# Patient Record
Sex: Male | Born: 1975 | Race: Black or African American | Hispanic: No | Marital: Married | Smoking: Never smoker
Health system: Southern US, Community
[De-identification: ages and names within clinical notes are randomized; demographics above are authoritative.]

## PROBLEM LIST (undated history)

## (undated) DIAGNOSIS — G4730 Sleep apnea, unspecified: Secondary | ICD-10-CM

## (undated) DIAGNOSIS — I10 Essential (primary) hypertension: Secondary | ICD-10-CM

## (undated) DIAGNOSIS — Z86718 Personal history of other venous thrombosis and embolism: Secondary | ICD-10-CM

## (undated) DIAGNOSIS — K219 Gastro-esophageal reflux disease without esophagitis: Secondary | ICD-10-CM

## (undated) DIAGNOSIS — M1990 Unspecified osteoarthritis, unspecified site: Secondary | ICD-10-CM

## (undated) HISTORY — PX: WISDOM TOOTH EXTRACTION: SHX21

## (undated) HISTORY — PX: APPENDECTOMY: SHX54

---
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
## 1998-02-10 ENCOUNTER — Encounter: Payer: Self-pay | Admitting: Emergency Medicine

## 1998-02-10 ENCOUNTER — Emergency Department (HOSPITAL_COMMUNITY): Admission: EM | Admit: 1998-02-10 | Discharge: 1998-02-10 | Payer: Self-pay | Admitting: Emergency Medicine

## 2001-06-11 ENCOUNTER — Encounter (HOSPITAL_BASED_OUTPATIENT_CLINIC_OR_DEPARTMENT_OTHER): Payer: Self-pay | Admitting: Internal Medicine

## 2001-06-11 ENCOUNTER — Encounter: Admission: RE | Admit: 2001-06-11 | Discharge: 2001-06-11 | Payer: Self-pay | Admitting: Internal Medicine

## 2013-07-01 ENCOUNTER — Encounter (HOSPITAL_BASED_OUTPATIENT_CLINIC_OR_DEPARTMENT_OTHER): Payer: Self-pay

## 2013-07-01 ENCOUNTER — Other Ambulatory Visit (HOSPITAL_BASED_OUTPATIENT_CLINIC_OR_DEPARTMENT_OTHER): Payer: Self-pay | Admitting: Family Medicine

## 2013-07-01 ENCOUNTER — Ambulatory Visit (HOSPITAL_BASED_OUTPATIENT_CLINIC_OR_DEPARTMENT_OTHER)
Admission: RE | Admit: 2013-07-01 | Discharge: 2013-07-01 | Disposition: A | Discharge status: Home / Self Care | Payer: BC Managed Care – PPO | Source: Ambulatory Visit | Attending: Family Medicine | Admitting: Family Medicine

## 2013-07-01 DIAGNOSIS — R791 Abnormal coagulation profile: Secondary | ICD-10-CM | POA: Insufficient documentation

## 2013-07-01 DIAGNOSIS — R7989 Other specified abnormal findings of blood chemistry: Secondary | ICD-10-CM

## 2013-07-01 MED ORDER — IOHEXOL 350 MG/ML SOLN
100.0000 mL | Freq: Once | INTRAVENOUS | Status: AC | PRN
  Administered 2013-07-01: 100 mL via INTRAVENOUS

## 2015-10-24 IMAGING — CT CT ANGIO CHEST
2 of 6 series · 19 of 36 positions shown · IV contrast (APPLIED)
Comparison: None.

CLINICAL DATA: Elevated D-dimer.

EXAM:
CT ANGIOGRAPHY CHEST WITH CONTRAST
TECHNIQUE: Multidetector CT imaging of the chest was performed using the
standard protocol during bolus administration of intravenous
contrast. Multiplanar CT image reconstructions and MIPs were
obtained to evaluate the vascular anatomy.
CONTRAST:  100mL OMNIPAQUE IOHEXOL 350 MG/ML SOLN

[Series 7: pe 1.0 b26f · axial · 0.74mm/px · z∈[-71,+203]mm · 18 of 306 slices shown]
[im 16/306  lung]
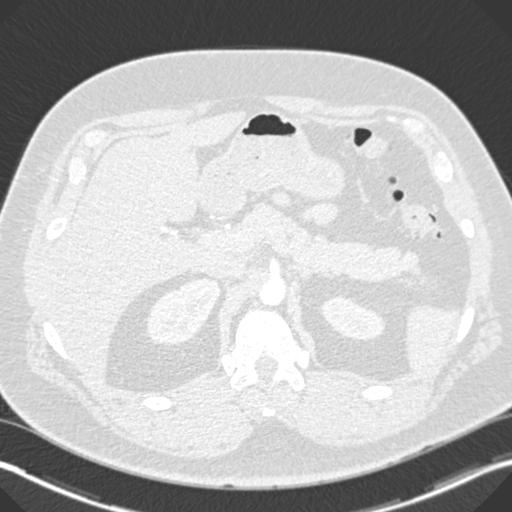
[im 31/306  mediastinal]
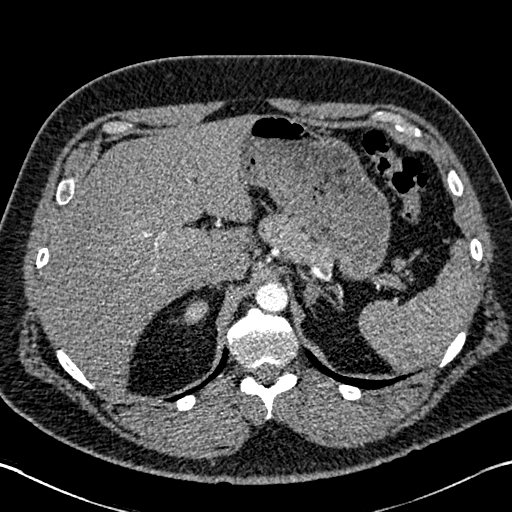
[im 46/306  lung]
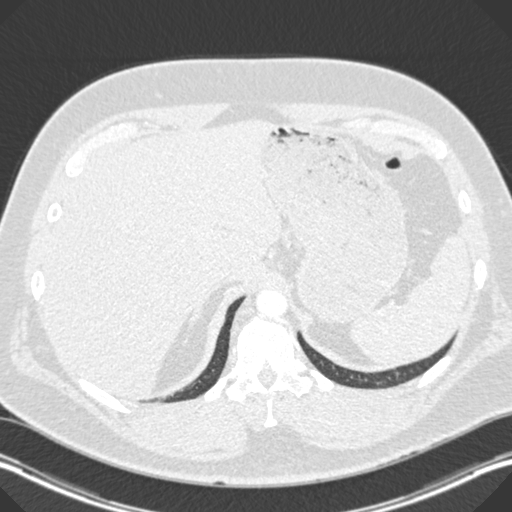
[im 62/306  mediastinal]
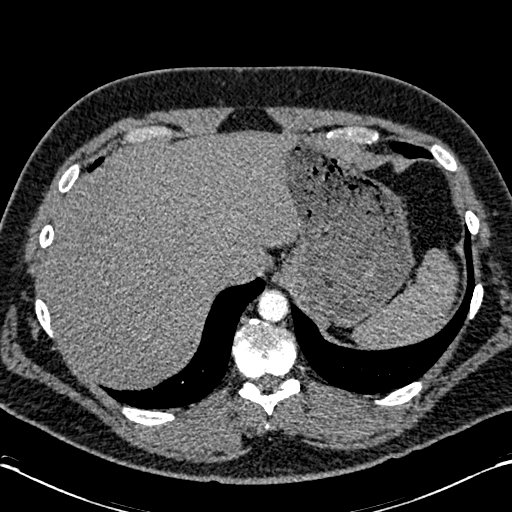
[im 77/306  lung]
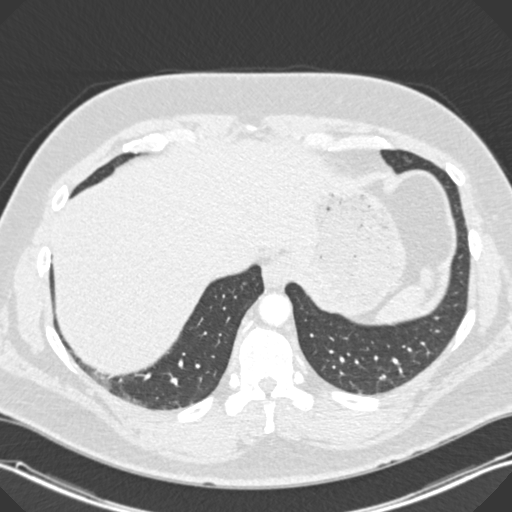
[im 92/306  mediastinal]
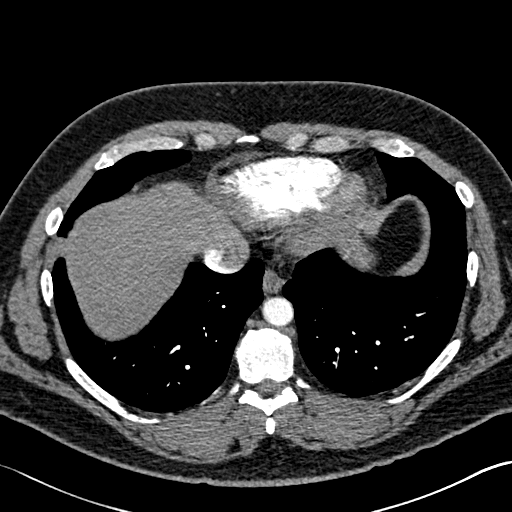
[im 107/306  lung]
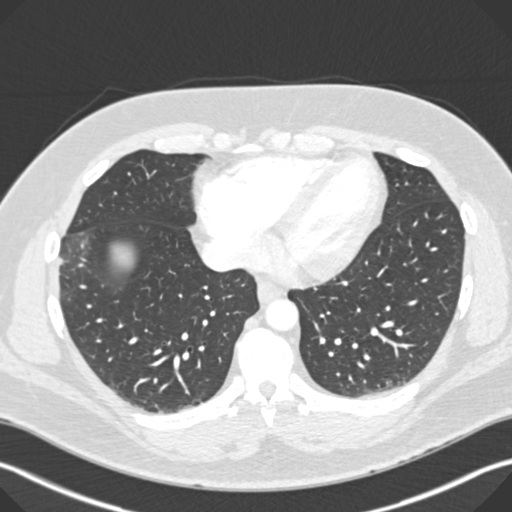
[im 123/306  mediastinal]
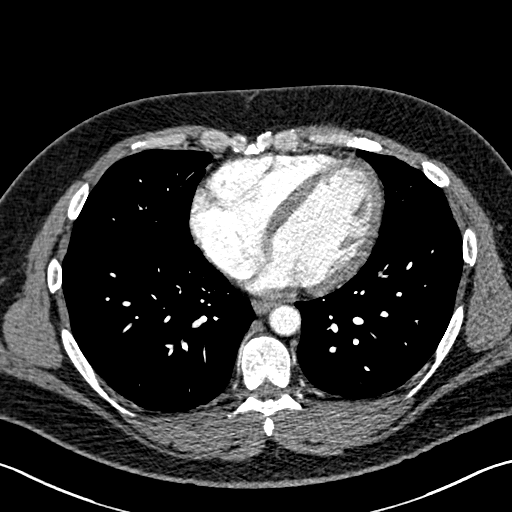
[im 138/306  lung]
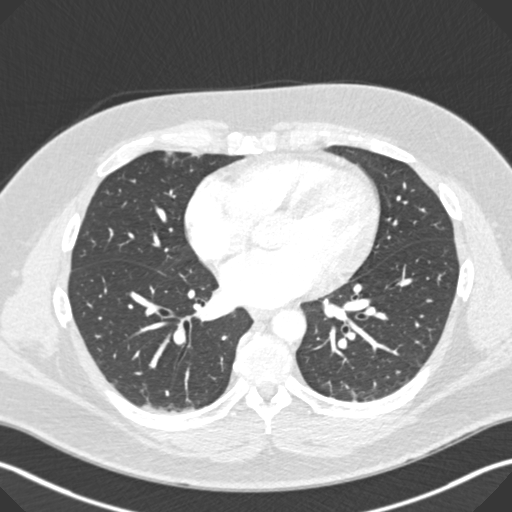
[im 168/306  mediastinal]
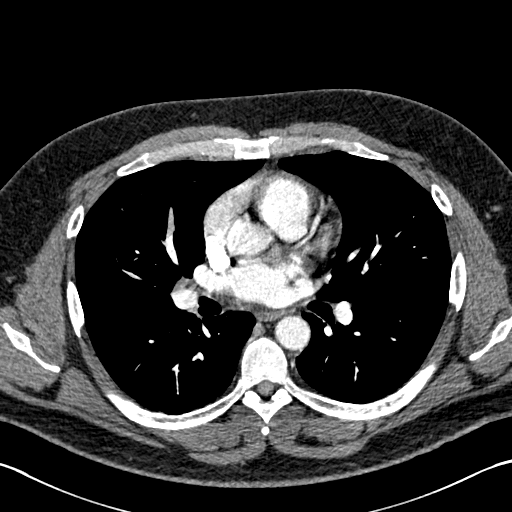
[im 184/306  lung]
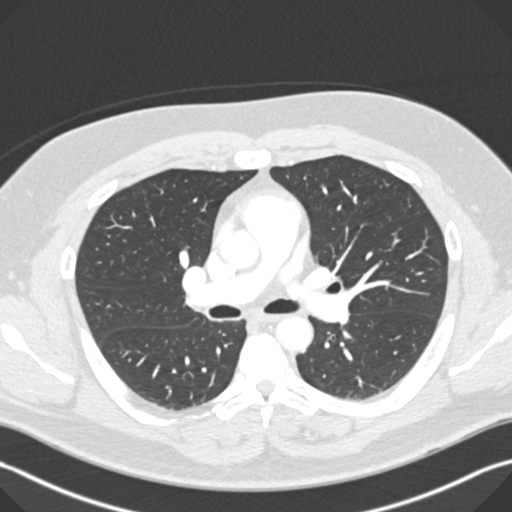
[im 199/306  mediastinal]
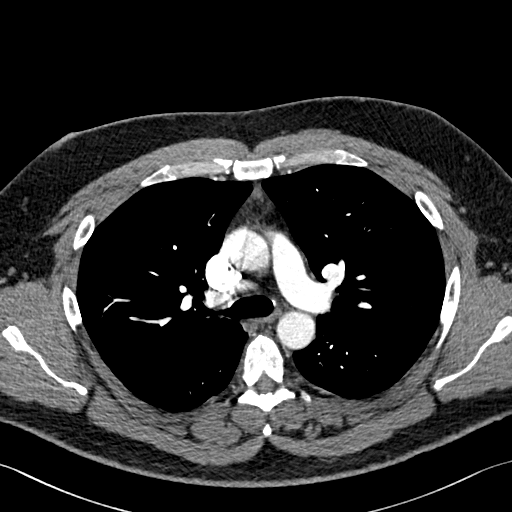
[im 214/306  lung]
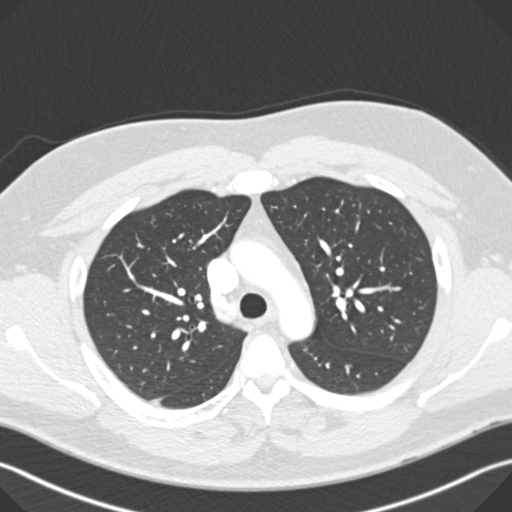
[im 229/306  mediastinal]
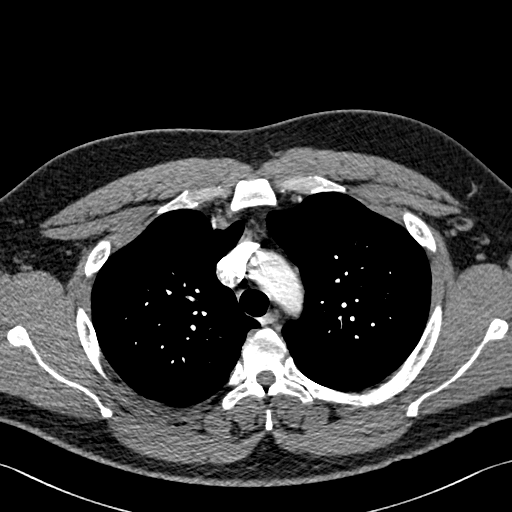
[im 245/306  lung]
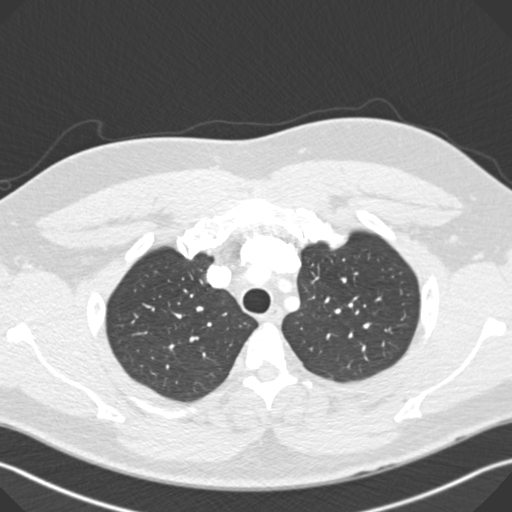
[im 260/306  mediastinal]
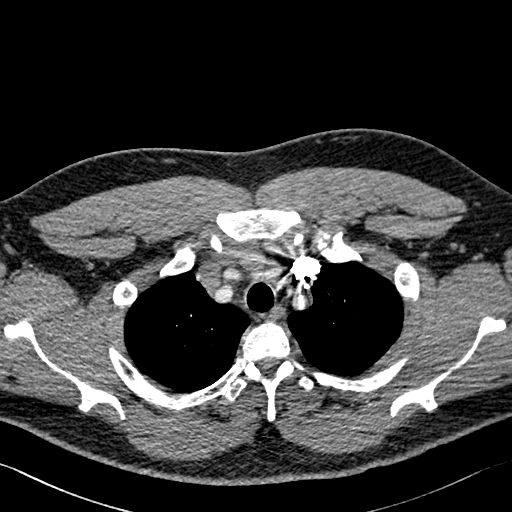
[im 275/306  lung]
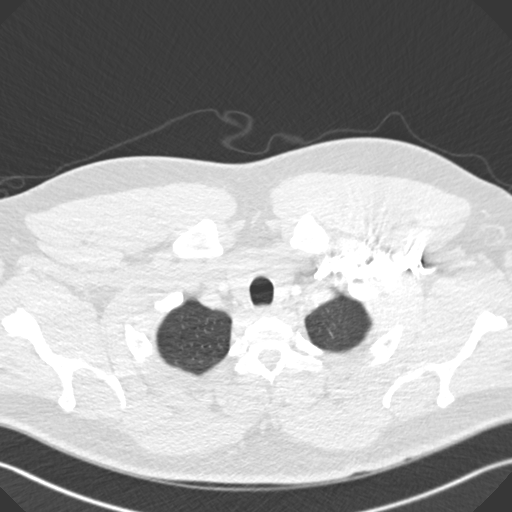
[im 290/306  mediastinal]
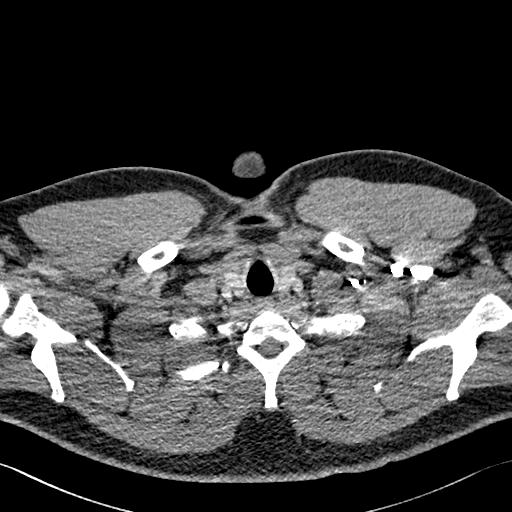

[Series 10: pe 2.0 coronal · coronal · 0.63mm/px · 1 of 128 slices shown]
[im 64/128  mediastinal]
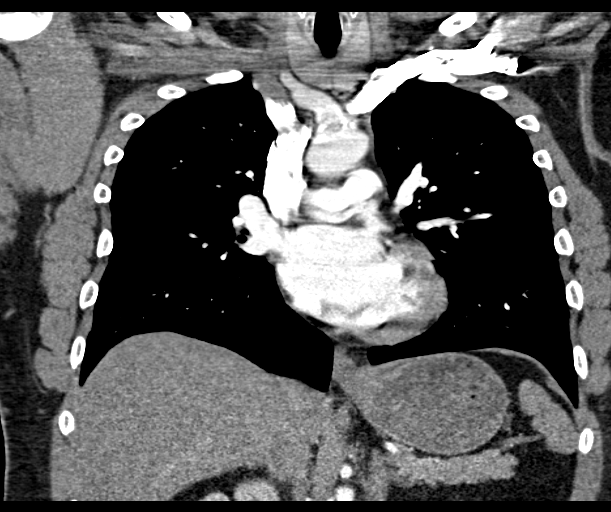

[19 of 36 positions shown; findings below may reference images not displayed]

FINDINGS: There is no evidence of pulmonary embolism. The pulmonary arteries
are of normal caliber. The thoracic aorta is also well opacified and
shows normal patency and caliber. No mediastinal abnormalities are
seen. The heart size is normal. No pleural or pericardial fluid is
identified.

Lungs show no evidence of infiltrates, edema or nodules. Focal area
of opacity at the right lateral lung base likely represents
atelectasis and/or scarring. Visualized upper abdomen is
unremarkable. No bony abnormalities are seen.

Review of the MIP images confirms the above findings.
IMPRESSION: Normal chest CTA demonstrating no evidence of pulmonary embolism or
other acute findings.

## 2019-06-08 ENCOUNTER — Ambulatory Visit: Payer: Self-pay | Attending: Internal Medicine

## 2019-06-08 DIAGNOSIS — Z23 Encounter for immunization: Secondary | ICD-10-CM

## 2019-06-08 NOTE — Progress Notes (Signed)
   [id number]  Name:  Davonta Stroot.    MRN: [id number] DOB: 10/30/1975  06/08/2019  Mr. Truex was observed post Covid-19 immunization for 15 minutes without incident. He was provided with Vaccine Information Sheet and instruction to access the V-Safe system.   Mr. Lamison was instructed to call 911 with any severe reactions post vaccine: Marland Kitchen Difficulty breathing  . Swelling of face and throat  . A fast heartbeat  . A bad rash all over body  . Dizziness and weakness   Immunizations Administered    Name Date Dose VIS Date Route   Pfizer COVID-19 Vaccine 06/08/2019  6:50 PM 0.3 mL 02/27/2019 Intramuscular   Manufacturer: [employer], [employer]   Lot: [id number]   NDC: [address]-4

## 2019-06-29 ENCOUNTER — Ambulatory Visit: Payer: Self-pay | Attending: Internal Medicine

## 2019-06-29 DIAGNOSIS — Z23 Encounter for immunization: Secondary | ICD-10-CM

## 2019-06-29 NOTE — Progress Notes (Signed)
   [id number]  Name:  Cory Cabrera.    MRN: [id number] DOB: 07/25/1975  06/29/2019  [name] was observed post Covid-19 immunization for 15 minutes without incident. He was provided with Vaccine Information Sheet and instruction to access the V-Safe system.   [name] was instructed to call 911 with any severe reactions post vaccine: Marland Kitchen Difficulty breathing  . Swelling of face and throat  . A fast heartbeat  . A bad rash all over body  . Dizziness and weakness   Immunizations Administered    Name Date Dose VIS Date Route   Pfizer COVID-19 Vaccine 06/29/2019  6:10 PM 0.3 mL 02/27/2019 Intramuscular   Manufacturer: [employer], [employer]   Lot: [id number]   NDC: [address]-0

## 2019-09-29 ENCOUNTER — Ambulatory Visit: Payer: Self-pay | Admitting: Student

## 2019-10-07 ENCOUNTER — Encounter (HOSPITAL_COMMUNITY): Payer: Self-pay

## 2019-10-07 NOTE — Progress Notes (Signed)
COVID Vaccine Completed: Yes Date COVID Vaccine completed: 06/08/19, 06/29/19 COVID vaccine manufacturer: Pfizer       PCP - Dr. Darlin Drop Cardiologist -   Chest x-ray -  EKG -  Stress Test -  ECHO -  Cardiac Cath -   Sleep Study -  CPAP -   Fasting Blood Sugar -  Checks Blood Sugar _____ times a day  Blood Thinner Instructions: Aspirin Instructions: Last Dose:  Anesthesia review:   Patient denies shortness of breath, fever, cough and chest pain at PAT appointment   Patient verbalized understanding of instructions that were given to them at the PAT appointment. Patient was also instructed that they will need to review over the PAT instructions again at home before surgery.

## 2019-10-07 NOTE — Patient Instructions (Addendum)
DUE TO COVID-19 ONLY ONE VISITOR ARE ALLOWED TO COME WITH YOU AND STAY IN THE WAITING ROOM ONLY DURING PRE OP AND PROCEDURE. THEN TWO VISITORS MAY VISIT WITH YOU IN YOUR PRIVATE ROOM DURING VISITING HOURS ONLY!! (10AM-8PM)   COVID SWAB TESTING MUST BE COMPLETED ON:  Saturday, October 17, 2019   @ 11:15 [address] [place] [facility] enter pre surgical testing line (Must self quarantine after testing. Follow instructions on handout.)             Your procedure is scheduled on: Wednesday, Aug. 4, 2021   Report to [facility] Main  Entrance   Report to Short Stay at 6:00 AM   [facility])   Call this number if you have problems the morning of surgery [phone]   Do not eat food :After Midnight.   May have liquids until 5:30 AM   day of surgery   CLEAR LIQUID DIET  Foods Allowed                                                                     Foods Excluded  Water, Black Coffee and tea, regular and decaf                             liquids that you cannot  Plain Jell-O in any flavor  (No red)                                           see through such as: Fruit ices (not with fruit pulp)                                     milk, soups, orange juice  Iced Popsicles (No red)                                    All solid food                                   Apple juices Sports drinks like Gatorade (No red) Lightly seasoned clear broth or consume(fat free) Sugar, honey syrup  Sample Menu Breakfast                                Lunch                                     Supper Cranberry juice                    Beef broth                            Chicken broth  Jell-O                                     Grape juice                           Apple juice Coffee or tea                        Jell-O                                      Popsicle                                                Coffee or tea                        Coffee or  tea   Complete one Ensure drink the morning of surgery at 5:30 AM the day of surgery.   Oral Hygiene is also important to reduce your risk of infection.                                    Remember - BRUSH YOUR TEETH THE MORNING OF SURGERY WITH YOUR REGULAR TOOTHPASTE   Do NOT smoke after Midnight   Take these medicines the morning of surgery with A SIP OF WATER: None                               You may not have any metal on your body including jewelry, and body piercings             Do not wear lotions, powders, perfumes/cologne, or deodorant                           Men may shave face and neck.   Do not bring valuables to the hospital. [facility] IS NOT             RESPONSIBLE   FOR VALUABLES.   Contacts, dentures or bridgework may not be worn into surgery.   Bring small overnight bag day of surgery.    Special Instructions: Bring a copy of your healthcare power of attorney and living will documents         the day of surgery if you haven't scanned them in before.              Please read over the following fact sheets you were given: IF YOU HAVE QUESTIONS ABOUT YOUR PRE OP INSTRUCTIONS PLEASE CALL [phone]   [facility] - Preparing for Surgery Before surgery, you can play an important role.  Because skin is not sterile, your skin needs to be as free of germs as possible.  You can reduce the number of germs on your skin by washing with CHG (chlorahexidine gluconate) soap before surgery.  CHG is an antiseptic cleaner which kills germs and bonds with the skin to continue killing germs even  after washing. Please DO NOT use if you have an allergy to CHG or antibacterial soaps.  If your skin becomes reddened/irritated stop using the CHG and inform your nurse when you arrive at Short Stay. Do not shave (including legs and underarms) for at least 48 hours prior to the first CHG shower.  You may shave your face/neck.  Please follow these instructions carefully:  1.  Shower with CHG  Soap the night before surgery and the  morning of surgery.  2.  If you choose to wash your hair, wash your hair first as usual with your normal  shampoo.  3.  After you shampoo, rinse your hair and body thoroughly to remove the shampoo.                             4.  Use CHG as you would any other liquid soap.  You can apply chg directly to the skin and wash.  Gently with a scrungie or clean washcloth.  5.  Apply the CHG Soap to your body ONLY FROM THE NECK DOWN.   Do   not use on face/ open                           Wound or open sores. Avoid contact with eyes, ears mouth and   genitals (private parts).                       Wash face,  Genitals (private parts) with your normal soap.             6.  Wash thoroughly, paying special attention to the area where your    surgery  will be performed.  7.  Thoroughly rinse your body with warm water from the neck down.  8.  DO NOT shower/wash with your normal soap after using and rinsing off the CHG Soap.                9.  Pat yourself dry with a clean towel.            10.  Wear clean pajamas.            11.  Place clean sheets on your bed the night of your first shower and do not  sleep with pets. Day of Surgery : Do not apply any lotions/deodorants the morning of surgery.  Please wear clean clothes to the hospital/surgery center.  FAILURE TO FOLLOW THESE INSTRUCTIONS MAY RESULT IN THE CANCELLATION OF YOUR SURGERY  PATIENT SIGNATURE_________________________________  NURSE SIGNATURE__________________________________  ________________________________________________________________________   Cory Cabrera  An incentive spirometer is a tool that can help keep your lungs clear and active. This tool measures how well you are filling your lungs with each breath. Taking long deep breaths may help reverse or decrease the chance of developing breathing (pulmonary) problems (especially infection) following:  A long period of time when you are  unable to move or be active. BEFORE THE PROCEDURE   If the spirometer includes an indicator to show your best effort, your nurse or respiratory therapist will set it to a desired goal.  If possible, sit up straight or lean slightly forward. Try not to slouch.  Hold the incentive spirometer in an upright position. INSTRUCTIONS FOR USE  1. Sit on the edge of your bed if possible, or sit up as far as you can in  bed or on a chair. 2. Hold the incentive spirometer in an upright position. 3. Breathe out normally. 4. Place the mouthpiece in your mouth and seal your lips tightly around it. 5. Breathe in slowly and as deeply as possible, raising the piston or the ball toward the top of the column. 6. Hold your breath for 3-5 seconds or for as long as possible. Allow the piston or ball to fall to the bottom of the column. 7. Remove the mouthpiece from your mouth and breathe out normally. 8. Rest for a few seconds and repeat Steps 1 through 7 at least 10 times every 1-2 hours when you are awake. Take your time and take a few normal breaths between deep breaths. 9. The spirometer may include an indicator to show your best effort. Use the indicator as a goal to work toward during each repetition. 10. After each set of 10 deep breaths, practice coughing to be sure your lungs are clear. If you have an incision (the cut made at the time of surgery), support your incision when coughing by placing a pillow or rolled up towels firmly against it. Once you are able to get out of bed, walk around indoors and cough well. You may stop using the incentive spirometer when instructed by your caregiver.  RISKS AND COMPLICATIONS  Take your time so you do not get dizzy or light-headed.  If you are in pain, you may need to take or ask for pain medication before doing incentive spirometry. It is harder to take a deep breath if you are having pain. AFTER USE  Rest and breathe slowly and easily.  It can be helpful to  keep track of a log of your progress. Your caregiver can provide you with a simple table to help with this. If you are using the spirometer at home, follow these instructions: SEEK MEDICAL CARE IF:   You are having difficultly using the spirometer.  You have trouble using the spirometer as often as instructed.  Your pain medication is not giving enough relief while using the spirometer.  You develop fever of 100.5 F (38.1 C) or higher. SEEK IMMEDIATE MEDICAL CARE IF:   You cough up bloody sputum that had not been present before.  You develop fever of 102 F (38.9 C) or greater.  You develop worsening pain at or near the incision site. MAKE SURE YOU:   Understand these instructions.  Will watch your condition.  Will get help right away if you are not doing well or get worse. Document Released: 07/16/2006 Document Revised: 05/28/2011 Document Reviewed: 09/16/2006 ExitCare Patient Information 2014 Exit[employer], [place].   ________________________________________________________________________  WHAT IS A BLOOD TRANSFUSION? Blood Transfusion Information  A transfusion is the replacement of blood or some of its parts. Blood is made up of multiple cells which provide different functions.  Red blood cells carry oxygen and are used for blood loss replacement.  White blood cells fight against infection.  Platelets control bleeding.  Plasma helps clot blood.  Other blood products are available for specialized needs, such as hemophilia or other clotting disorders. BEFORE THE TRANSFUSION  Who gives blood for transfusions?   Healthy volunteers who are fully evaluated to make sure their blood is safe. This is blood bank blood. Transfusion therapy is the safest it has ever been in the practice of medicine. Before blood is taken from a donor, a complete history is taken to make sure that person has no history of diseases nor engages in risky  social behavior (examples are intravenous drug  use or sexual activity with multiple partners). The donor's travel history is screened to minimize risk of transmitting infections, such as malaria. The donated blood is tested for signs of infectious diseases, such as HIV and hepatitis. The blood is then tested to be sure it is compatible with you in order to minimize the chance of a transfusion reaction. If you or a relative donates blood, this is often done in anticipation of surgery and is not appropriate for emergency situations. It takes many days to process the donated blood. RISKS AND COMPLICATIONS Although transfusion therapy is very safe and saves many lives, the main dangers of transfusion include:   Getting an infectious disease.  Developing a transfusion reaction. This is an allergic reaction to something in the blood you were given. Every precaution is taken to prevent this. The decision to have a blood transfusion has been considered carefully by your caregiver before blood is given. Blood is not given unless the benefits outweigh the risks. AFTER THE TRANSFUSION  Right after receiving a blood transfusion, you will usually feel much better and more energetic. This is especially true if your red blood cells have gotten low (anemic). The transfusion raises the level of the red blood cells which carry oxygen, and this usually causes an energy increase.  The nurse administering the transfusion will monitor you carefully for complications. HOME CARE INSTRUCTIONS  No special instructions are needed after a transfusion. You may find your energy is better. Speak with your caregiver about any limitations on activity for underlying diseases you may have. SEEK MEDICAL CARE IF:   Your condition is not improving after your transfusion.  You develop redness or irritation at the intravenous (IV) site. SEEK IMMEDIATE MEDICAL CARE IF:  Any of the following symptoms occur over the next 12 hours:  Shaking chills.  You have a temperature by mouth  above 102 F (38.9 C), not controlled by medicine.  Chest, back, or muscle pain.  People around you feel you are not acting correctly or are confused.  Shortness of breath or difficulty breathing.  Dizziness and fainting.  You get a rash or develop hives.  You have a decrease in urine output.  Your urine turns a dark color or changes to pink, red, or brown. Any of the following symptoms occur over the next 10 days:  You have a temperature by mouth above 102 F (38.9 C), not controlled by medicine.  Shortness of breath.  Weakness after normal activity.  The white part of the eye turns yellow (jaundice).  You have a decrease in the amount of urine or are urinating less often.  Your urine turns a dark color or changes to pink, red, or brown. Document Released: 03/02/2000 Document Revised: 05/28/2011 Document Reviewed: 10/20/2007 [facility] Patient Information 2014 [place], [place].  _______________________________________________________________________

## 2019-10-12 ENCOUNTER — Encounter (HOSPITAL_COMMUNITY): Payer: Self-pay

## 2019-10-12 ENCOUNTER — Other Ambulatory Visit: Payer: Self-pay

## 2019-10-12 ENCOUNTER — Encounter (HOSPITAL_COMMUNITY)
Admission: RE | Admit: 2019-10-12 | Discharge: 2019-10-12 | Disposition: A | Discharge status: Home / Self Care | Payer: BC Managed Care – PPO | Source: Ambulatory Visit | Attending: Orthopedic Surgery | Admitting: Orthopedic Surgery

## 2019-10-12 DIAGNOSIS — Z01812 Encounter for preprocedural laboratory examination: Secondary | ICD-10-CM | POA: Diagnosis present

## 2019-10-12 HISTORY — DX: Sleep apnea, unspecified: G47.30

## 2019-10-12 HISTORY — DX: Essential (primary) hypertension: I10

## 2019-10-12 HISTORY — DX: Gastro-esophageal reflux disease without esophagitis: K21.9

## 2019-10-12 HISTORY — DX: Personal history of other venous thrombosis and embolism: Z86.718

## 2019-10-12 HISTORY — DX: Unspecified osteoarthritis, unspecified site: M19.90

## 2019-10-12 LAB — COMPREHENSIVE METABOLIC PANEL
ALT: 17 U/L (ref 0–44)
AST: 19 U/L (ref 15–41)
Albumin: 4 g/dL (ref 3.5–5.0)
Alkaline Phosphatase: 56 U/L (ref 38–126)
Anion gap: 10 (ref 5–15)
BUN: 17 mg/dL (ref 6–20)
CO2: 28 mmol/L (ref 22–32)
Calcium: 9.5 mg/dL (ref 8.9–10.3)
Chloride: 102 mmol/L (ref 98–111)
Creatinine, Ser: 0.97 mg/dL (ref 0.61–1.24)
GFR calc Af Amer: >60 mL/min (ref >60)
GFR calc non Af Amer: >60 mL/min (ref >60)
Glucose, Bld: 103 mg/dL — ABNORMAL HIGH (ref 70–99)
Potassium: 4.3 mmol/L (ref 3.5–5.1)
Sodium: 140 mmol/L (ref 135–145)
Total Bilirubin: 0.7 mg/dL (ref 0.3–1.2)
Total Protein: 7.2 g/dL (ref 6.5–8.1)

## 2019-10-12 LAB — CBC
HCT: 45.1 % (ref 39.0–52.0)
Hemoglobin: 15.3 g/dL (ref 13.0–17.0)
MCH: 34.1 pg — ABNORMAL HIGH (ref 26.0–34.0)
MCHC: 33.9 g/dL (ref 30.0–36.0)
MCV: 100.4 fL — ABNORMAL HIGH (ref 80.0–100.0)
Platelets: 229 10*3/uL (ref 150–400)
RBC: 4.49 MIL/uL (ref 4.22–5.81)
RDW: 12.7 % (ref 11.5–15.5)
WBC: 4.9 10*3/uL (ref 4.0–10.5)
nRBC: 0 % (ref 0.0–0.2)

## 2019-10-12 LAB — URINALYSIS, ROUTINE W REFLEX MICROSCOPIC
Bilirubin Urine: NEGATIVE
Glucose, UA: NEGATIVE mg/dL
Hgb urine dipstick: NEGATIVE
Ketones, ur: NEGATIVE mg/dL
Leukocytes,Ua: NEGATIVE
Nitrite: NEGATIVE
Protein, ur: NEGATIVE mg/dL
Specific Gravity, Urine: 1.031 — ABNORMAL HIGH (ref 1.005–1.030)
pH: 5 (ref 5.0–8.0)

## 2019-10-12 LAB — SURGICAL PCR SCREEN
MRSA, PCR: NEGATIVE
Staphylococcus aureus: NEGATIVE

## 2019-10-12 LAB — PROTIME-INR
INR: 1 (ref 0.8–1.2)
Prothrombin Time: 12.9 seconds (ref 11.4–15.2)

## 2019-10-12 NOTE — Progress Notes (Signed)
COVID Vaccine Completed: Yes Date COVID Vaccine completed: 06/08/19, 06/29/19 COVID vaccine manufacturer: Pfizer       PCP - Dr. Darlin Drop Cardiologist - N/A  Chest x-ray - N/A  EKG - N/A  Stress Test - N/A  ECHO - N/A  Cardiac Cath - N/A   Sleep Study - N/A CPAP - N/A  Fasting Blood Sugar - N/A  Checks Blood Sugar _____ times a day  Blood Thinner Instructions:N/A  Aspirin Instructions:N/A  Last Dose:  Anesthesia review:   Patient denies shortness of breath, fever, cough and chest pain at PAT appointment   Patient verbalized understanding of instructions that were given to them at the PAT appointment. Patient was also instructed that they will need to review over the PAT instructions again at home before surgery.

## 2019-10-12 NOTE — Progress Notes (Signed)
   10/12/19 0823  OBSTRUCTIVE SLEEP APNEA  Have you ever been diagnosed with sleep apnea through a sleep study? No  Do you snore loudly (loud enough to be heard through closed doors)?  1  Do you often feel tired, fatigued, or sleepy during the daytime (such as falling asleep during driving or talking to someone)? 1  Has anyone observed you stop breathing during your sleep? 1  Do you have, or are you being treated for high blood pressure? 1  BMI more than 35 kg/m2? 1  Age > 50 (1-yes) 0  Neck circumference greater than:Male 16 inches or larger, Male 17inches or larger? 1  Male Gender (Yes=1) 1  Obstructive Sleep Apnea Score 7

## 2019-10-13 ENCOUNTER — Ambulatory Visit: Payer: Self-pay | Admitting: Student

## 2019-10-13 NOTE — H&P (Signed)
TOTAL HIP ADMISSION H&P  Patient is admitted for right total hip arthroplasty.  Subjective:  Chief Complaint: right hip pain  HPI: Cory Cabrera., 44 y.o. male, has a history of pain and functional disability in the right hip(s) due to arthritis and patient has failed non-surgical conservative treatments for greater than 12 weeks to include NSAID's and/or analgesics and activity modification.  Onset of symptoms was gradual starting >10 years ago with gradually worsening course since that time.The patient noted no past surgery on the right hip(s).  Patient currently rates pain in the right hip at 8 out of 10 with activity. Patient has worsening of pain with activity and weight bearing, pain that interfers with activities of daily living and pain with passive range of motion. Patient has evidence of subchondral sclerosis and joint space narrowing by imaging studies. This condition presents safety issues increasing the risk of falls. There is no current active infection.  There are no problems to display for this patient.  Past Medical History:  Diagnosis Date  . Arthritis   . GERD (gastroesophageal reflux disease)    No longer issue  . History of blood clots    coughed up  . Hypertension   . Sleep apnea     Past Surgical History:  Procedure Laterality Date  . APPENDECTOMY    . WISDOM TOOTH EXTRACTION      Current Outpatient Medications  Medication Sig Dispense Refill Last Dose  . losartan-hydrochlorothiazide (HYZAAR) 100-12.5 MG tablet Take 1 tablet by mouth daily.      No current facility-administered medications for this visit.   Allergies  Allergen Reactions  . Iohexol Nausea And Vomiting    Pt vomited after contrast injection    Social History   Tobacco Use  . Smoking status: Never Smoker  . Smokeless tobacco: Never Used  Substance Use Topics  . Alcohol use: Never    No family history on file.   Review of Systems  Musculoskeletal: Positive for arthralgias.   All other systems reviewed and are negative.   Objective:  Physical Exam Constitutional:      Appearance: Normal appearance.  HENT:     Head: Normocephalic and atraumatic.  Eyes:     Extraocular Movements: Extraocular movements intact.     Pupils: Pupils are equal, round, and reactive to light.  Cardiovascular:     Rate and Rhythm: Normal rate and regular rhythm.     Pulses: Normal pulses.     Heart sounds: Normal heart sounds.  Pulmonary:     Effort: Pulmonary effort is normal.     Breath sounds: Normal breath sounds.  Abdominal:     Palpations: Abdomen is soft.     Tenderness: There is no abdominal tenderness.  Genitourinary:    Comments: Deferred Musculoskeletal:     Cervical back: Normal range of motion.     Comments: Examination of the right hip reveals the skin is clear. No wounds or lesions. He does have a significant leg length discrepancy, right is shorter than the left by about 1 inch. The right hip is extremely stiff. There is a 30 degree flexion contracture and I can flex them up to 55 degrees with significant discomfort. There is no internal rotation and approximately 10 degrees of external rotation.   Skin:    General: Skin is warm and dry.  Neurological:     General: No focal deficit present.     Mental Status: He is alert and oriented to person, place, and  time.  Psychiatric:        Mood and Affect: Mood normal.     Vital signs in last 24 hours: @VSRANGES @  Labs:   Estimated body mass index is 36.48 kg/m as calculated from the following:   Height as of 10/12/19: 6' (1.829 m).   Weight as of 10/12/19: 122 kg.   Imaging Review Plain radiographs demonstrate severe degenerative joint disease of the right hip(s). The bone quality appears to be adequate for age and reported activity level.      Assessment/Plan:  End stage arthritis, right hip(s)  The patient history, physical examination, clinical judgement of the provider and imaging studies are  consistent with end stage degenerative joint disease of the right hip(s) and total hip arthroplasty is deemed medically necessary. The treatment options including medical management, injection therapy, arthroscopy and arthroplasty were discussed at length. The risks and benefits of total hip arthroplasty were presented and reviewed. The risks due to aseptic loosening, infection, stiffness, dislocation/subluxation,  thromboembolic complications and other imponderables were discussed.  The patient acknowledged the explanation, agreed to proceed with the plan and consent was signed. Patient is being admitted for inpatient treatment for surgery, pain control, PT, OT, prophylactic antibiotics, VTE prophylaxis, progressive ambulation and ADL's and discharge planning.The patient is planning to be discharged home with his wife where he will complete a home exercise program.

## 2019-10-13 NOTE — H&P (View-Only) (Signed)
TOTAL HIP ADMISSION H&P  Patient is admitted for right total hip arthroplasty.  Subjective:  Chief Complaint: right hip pain  HPI: Cory Primus., 44 y.o. male, has a history of pain and functional disability in the right hip(s) due to arthritis and patient has failed non-surgical conservative treatments for greater than 12 weeks to include NSAID's and/or analgesics and activity modification.  Onset of symptoms was gradual starting >10 years ago with gradually worsening course since that time.The patient noted no past surgery on the right hip(s).  Patient currently rates pain in the right hip at 8 out of 10 with activity. Patient has worsening of pain with activity and weight bearing, pain that interfers with activities of daily living and pain with passive range of motion. Patient has evidence of subchondral sclerosis and joint space narrowing by imaging studies. This condition presents safety issues increasing the risk of falls. There is no current active infection.  There are no problems to display for this patient.  Past Medical History:  Diagnosis Date  . Arthritis   . GERD (gastroesophageal reflux disease)    No longer issue  . History of blood clots    coughed up  . Hypertension   . Sleep apnea     Past Surgical History:  Procedure Laterality Date  . APPENDECTOMY    . WISDOM TOOTH EXTRACTION      Current Outpatient Medications  Medication Sig Dispense Refill Last Dose  . losartan-hydrochlorothiazide (HYZAAR) 100-12.5 MG tablet Take 1 tablet by mouth daily.      No current facility-administered medications for this visit.   Allergies  Allergen Reactions  . Iohexol Nausea And Vomiting    Pt vomited after contrast injection    Social History   Tobacco Use  . Smoking status: Never Smoker  . Smokeless tobacco: Never Used  Substance Use Topics  . Alcohol use: Never    No family history on file.   Review of Systems  Musculoskeletal: Positive for arthralgias.   All other systems reviewed and are negative.   Objective:  Physical Exam Constitutional:      Appearance: Normal appearance.  HENT:     Head: Normocephalic and atraumatic.  Eyes:     Extraocular Movements: Extraocular movements intact.     Pupils: Pupils are equal, round, and reactive to light.  Cardiovascular:     Rate and Rhythm: Normal rate and regular rhythm.     Pulses: Normal pulses.     Heart sounds: Normal heart sounds.  Pulmonary:     Effort: Pulmonary effort is normal.     Breath sounds: Normal breath sounds.  Abdominal:     Palpations: Abdomen is soft.     Tenderness: There is no abdominal tenderness.  Genitourinary:    Comments: Deferred Musculoskeletal:     Cervical back: Normal range of motion.     Comments: Examination of the right hip reveals the skin is clear. No wounds or lesions. He does have a significant leg length discrepancy, right is shorter than the left by about 1 inch. The right hip is extremely stiff. There is a 30 degree flexion contracture and I can flex them up to 55 degrees with significant discomfort. There is no internal rotation and approximately 10 degrees of external rotation.   Skin:    General: Skin is warm and dry.  Neurological:     General: No focal deficit present.     Mental Status: He is alert and oriented to person, place, and  time.  Psychiatric:        Mood and Affect: Mood normal.     Vital signs in last 24 hours: @VSRANGES@  Labs:   Estimated body mass index is 36.48 kg/m as calculated from the following:   Height as of 10/12/19: 6' (1.829 m).   Weight as of 10/12/19: 122 kg.   Imaging Review Plain radiographs demonstrate severe degenerative joint disease of the right hip(s). The bone quality appears to be adequate for age and reported activity level.      Assessment/Plan:  End stage arthritis, right hip(s)  The patient history, physical examination, clinical judgement of the provider and imaging studies are  consistent with end stage degenerative joint disease of the right hip(s) and total hip arthroplasty is deemed medically necessary. The treatment options including medical management, injection therapy, arthroscopy and arthroplasty were discussed at length. The risks and benefits of total hip arthroplasty were presented and reviewed. The risks due to aseptic loosening, infection, stiffness, dislocation/subluxation,  thromboembolic complications and other imponderables were discussed.  The patient acknowledged the explanation, agreed to proceed with the plan and consent was signed. Patient is being admitted for inpatient treatment for surgery, pain control, PT, OT, prophylactic antibiotics, VTE prophylaxis, progressive ambulation and ADL's and discharge planning.The patient is planning to be discharged home with his wife where he will complete a home exercise program.  

## 2019-10-17 ENCOUNTER — Other Ambulatory Visit (HOSPITAL_COMMUNITY)
Admission: RE | Admit: 2019-10-17 | Discharge: 2019-10-17 | Disposition: A | Discharge status: Home / Self Care | Payer: BC Managed Care – PPO | Source: Ambulatory Visit | Attending: Orthopedic Surgery | Admitting: Orthopedic Surgery

## 2019-10-17 DIAGNOSIS — Z01818 Encounter for other preprocedural examination: Secondary | ICD-10-CM | POA: Diagnosis not present

## 2019-10-17 DIAGNOSIS — Z20822 Contact with and (suspected) exposure to covid-19: Secondary | ICD-10-CM | POA: Insufficient documentation

## 2019-10-17 LAB — SARS CORONAVIRUS 2 (TAT 6-24 HRS): SARS Coronavirus 2: NEGATIVE

## 2019-10-20 MED ORDER — DEXTROSE 5 % IV SOLN
3.0000 g | INTRAVENOUS | Status: AC
  Administered 2019-10-21: 3 g via INTRAVENOUS
  Filled 2019-10-20: qty 3

## 2019-10-21 ENCOUNTER — Observation Stay (HOSPITAL_COMMUNITY): Payer: BC Managed Care – PPO

## 2019-10-21 ENCOUNTER — Ambulatory Visit (HOSPITAL_COMMUNITY): Payer: BC Managed Care – PPO | Admitting: Physician Assistant

## 2019-10-21 ENCOUNTER — Ambulatory Visit (HOSPITAL_COMMUNITY): Payer: BC Managed Care – PPO

## 2019-10-21 ENCOUNTER — Encounter (HOSPITAL_COMMUNITY)
Admission: RE | Disposition: A | Discharge status: Home / Self Care | Payer: Self-pay | Source: Home / Self Care | Attending: Orthopedic Surgery

## 2019-10-21 ENCOUNTER — Observation Stay (HOSPITAL_COMMUNITY)
Admission: RE | Admit: 2019-10-21 | Discharge: 2019-10-22 | Disposition: A | Discharge status: Home / Self Care | Payer: BC Managed Care – PPO | Attending: Orthopedic Surgery | Admitting: Orthopedic Surgery

## 2019-10-21 ENCOUNTER — Other Ambulatory Visit: Payer: Self-pay

## 2019-10-21 ENCOUNTER — Encounter (HOSPITAL_COMMUNITY): Payer: Self-pay | Admitting: Orthopedic Surgery

## 2019-10-21 ENCOUNTER — Ambulatory Visit (HOSPITAL_COMMUNITY): Payer: BC Managed Care – PPO | Admitting: Certified Registered Nurse Anesthetist

## 2019-10-21 DIAGNOSIS — I10 Essential (primary) hypertension: Secondary | ICD-10-CM | POA: Insufficient documentation

## 2019-10-21 DIAGNOSIS — M1611 Unilateral primary osteoarthritis, right hip: Principal | ICD-10-CM | POA: Diagnosis present

## 2019-10-21 DIAGNOSIS — Z09 Encounter for follow-up examination after completed treatment for conditions other than malignant neoplasm: Secondary | ICD-10-CM

## 2019-10-21 DIAGNOSIS — Z419 Encounter for procedure for purposes other than remedying health state, unspecified: Secondary | ICD-10-CM

## 2019-10-21 DIAGNOSIS — M25551 Pain in right hip: Secondary | ICD-10-CM | POA: Diagnosis present

## 2019-10-21 DIAGNOSIS — M1711 Unilateral primary osteoarthritis, right knee: Secondary | ICD-10-CM | POA: Diagnosis present

## 2019-10-21 HISTORY — PX: TOTAL HIP ARTHROPLASTY: SHX124

## 2019-10-21 LAB — TYPE AND SCREEN
ABO/RH(D): O POS
Antibody Screen: NEGATIVE

## 2019-10-21 LAB — ABO/RH: ABO/RH(D): O POS

## 2019-10-21 SURGERY — ARTHROPLASTY, HIP, TOTAL, ANTERIOR APPROACH
Anesthesia: Spinal | Site: Hip | Laterality: Right

## 2019-10-21 MED ORDER — POLYETHYLENE GLYCOL 3350 17 G PO PACK: 17.0000 g | PACK | Freq: Every day | ORAL | Status: DC | PRN

## 2019-10-21 MED ORDER — LACTATED RINGERS IV SOLN: INTRAVENOUS | Status: DC

## 2019-10-21 MED ORDER — CEFAZOLIN SODIUM-DEXTROSE 2-4 GM/100ML-% IV SOLN
2.0000 g | Freq: Four times a day (QID) | INTRAVENOUS | Status: AC
  Administered 2019-10-21 (×2): 2 g via INTRAVENOUS
  Filled 2019-10-21 (×2): qty 100

## 2019-10-21 MED ORDER — TRANEXAMIC ACID-NACL 1000-0.7 MG/100ML-% IV SOLN
1000.0000 mg | INTRAVENOUS | Status: AC
  Administered 2019-10-21: 1000 mg via INTRAVENOUS
  Filled 2019-10-21: qty 100

## 2019-10-21 MED ORDER — BUPIVACAINE HCL 0.5 % IJ SOLN: INTRAMUSCULAR | Status: DC | PRN

## 2019-10-21 MED ORDER — LIDOCAINE 2% (20 MG/ML) 5 ML SYRINGE
INTRAMUSCULAR | Status: AC
  Filled 2019-10-21: qty 5

## 2019-10-21 MED ORDER — HYDROCODONE-ACETAMINOPHEN 7.5-325 MG PO TABS: 1.0000 | ORAL_TABLET | ORAL | Status: DC | PRN

## 2019-10-21 MED ORDER — ONDANSETRON HCL 4 MG/2ML IJ SOLN
4.0000 mg | Freq: Four times a day (QID) | INTRAMUSCULAR | Status: DC | PRN
  Administered 2019-10-21: 4 mg via INTRAVENOUS
  Filled 2019-10-21: qty 2

## 2019-10-21 MED ORDER — BUPIVACAINE HCL (PF) 0.5 % IJ SOLN
INTRAMUSCULAR | Status: DC | PRN
Start: 2019-10-21 — End: 2019-10-21
  Administered 2019-10-21: 3 mL via INTRATHECAL

## 2019-10-21 MED ORDER — EPHEDRINE 5 MG/ML INJ
INTRAVENOUS | Status: AC
  Filled 2019-10-21: qty 10

## 2019-10-21 MED ORDER — SODIUM CHLORIDE 0.9 % IV SOLN
INTRAVENOUS | Status: DC
  Administered 2019-10-21: 1000 mL via INTRAVENOUS

## 2019-10-21 MED ORDER — MIDAZOLAM HCL 5 MG/5ML IJ SOLN
INTRAMUSCULAR | Status: DC | PRN
  Administered 2019-10-21: 2 mg via INTRAVENOUS

## 2019-10-21 MED ORDER — BUPIVACAINE-EPINEPHRINE (PF) 0.25% -1:200000 IJ SOLN
INTRAMUSCULAR | Status: AC
  Filled 2019-10-21: qty 30

## 2019-10-21 MED ORDER — MORPHINE SULFATE (PF) 2 MG/ML IV SOLN
0.5000 mg | INTRAVENOUS | Status: DC | PRN
  Administered 2019-10-21: 1 mg via INTRAVENOUS
  Filled 2019-10-21: qty 1

## 2019-10-21 MED ORDER — 0.9 % SODIUM CHLORIDE (POUR BTL) OPTIME
TOPICAL | Status: DC | PRN
  Administered 2019-10-21: 1000 mL

## 2019-10-21 MED ORDER — SODIUM CHLORIDE (PF) 0.9 % IJ SOLN
INTRAMUSCULAR | Status: AC
  Filled 2019-10-21: qty 50

## 2019-10-21 MED ORDER — ORAL CARE MOUTH RINSE: 15.0000 mL | Freq: Once | OROMUCOSAL | Status: AC

## 2019-10-21 MED ORDER — FENTANYL CITRATE (PF) 100 MCG/2ML IJ SOLN
INTRAMUSCULAR | Status: AC
  Filled 2019-10-21: qty 2

## 2019-10-21 MED ORDER — DEXAMETHASONE SODIUM PHOSPHATE 10 MG/ML IJ SOLN
INTRAMUSCULAR | Status: AC
  Filled 2019-10-21: qty 1

## 2019-10-21 MED ORDER — DOCUSATE SODIUM 100 MG PO CAPS
100.0000 mg | ORAL_CAPSULE | Freq: Two times a day (BID) | ORAL | Status: DC
  Administered 2019-10-21 – 2019-10-22 (×2): 100 mg via ORAL
  Filled 2019-10-21 (×2): qty 1

## 2019-10-21 MED ORDER — LACTATED RINGERS IV SOLN: INTRAVENOUS | Status: DC | PRN

## 2019-10-21 MED ORDER — MENTHOL 3 MG MT LOZG: 1.0000 | LOZENGE | OROMUCOSAL | Status: DC | PRN

## 2019-10-21 MED ORDER — PHENYLEPHRINE HCL-NACL 10-0.9 MG/250ML-% IV SOLN
INTRAVENOUS | Status: DC | PRN
  Administered 2019-10-21: 50 ug/min via INTRAVENOUS

## 2019-10-21 MED ORDER — KETOROLAC TROMETHAMINE 30 MG/ML IJ SOLN
INTRAMUSCULAR | Status: DC | PRN
  Administered 2019-10-21: 30 mg via INTRA_ARTICULAR

## 2019-10-21 MED ORDER — ASPIRIN 81 MG PO CHEW
81.0000 mg | CHEWABLE_TABLET | Freq: Two times a day (BID) | ORAL | Status: DC
  Administered 2019-10-21 – 2019-10-22 (×2): 81 mg via ORAL
  Filled 2019-10-21 (×2): qty 1

## 2019-10-21 MED ORDER — IRRISEPT - 450ML BOTTLE WITH 0.05% CHG IN STERILE WATER, USP 99.95% OPTIME
TOPICAL | Status: DC | PRN
  Administered 2019-10-21: 450 mL

## 2019-10-21 MED ORDER — ONDANSETRON HCL 4 MG/2ML IJ SOLN
INTRAMUSCULAR | Status: AC
  Filled 2019-10-21: qty 2

## 2019-10-21 MED ORDER — WATER FOR IRRIGATION, STERILE IR SOLN
Status: DC | PRN
  Administered 2019-10-21: 2000 mL

## 2019-10-21 MED ORDER — SODIUM CHLORIDE 0.9 % IR SOLN
Status: DC | PRN
  Administered 2019-10-21: 3000 mL

## 2019-10-21 MED ORDER — HYDROCODONE-ACETAMINOPHEN 5-325 MG PO TABS
1.0000 | ORAL_TABLET | ORAL | Status: DC | PRN
  Administered 2019-10-21 – 2019-10-22 (×4): 2 via ORAL
  Filled 2019-10-21 (×4): qty 2

## 2019-10-21 MED ORDER — DEXAMETHASONE SODIUM PHOSPHATE 10 MG/ML IJ SOLN
INTRAMUSCULAR | Status: DC | PRN
  Administered 2019-10-21: 10 mg via INTRAVENOUS

## 2019-10-21 MED ORDER — METOCLOPRAMIDE HCL 5 MG PO TABS: 5.0000 mg | ORAL_TABLET | Freq: Three times a day (TID) | ORAL | Status: DC | PRN

## 2019-10-21 MED ORDER — POVIDONE-IODINE 10 % EX SWAB
2.0000 "application " | Freq: Once | CUTANEOUS | Status: AC
  Administered 2019-10-21: 2 via TOPICAL

## 2019-10-21 MED ORDER — METHOCARBAMOL 1000 MG/10ML IJ SOLN
500.0000 mg | Freq: Four times a day (QID) | INTRAVENOUS | Status: DC | PRN
  Filled 2019-10-21: qty 5

## 2019-10-21 MED ORDER — CHLORHEXIDINE GLUCONATE 0.12 % MT SOLN
15.0000 mL | Freq: Once | OROMUCOSAL | Status: AC
  Administered 2019-10-21: 15 mL via OROMUCOSAL

## 2019-10-21 MED ORDER — ISOPROPYL ALCOHOL 70 % SOLN
Status: DC | PRN
  Administered 2019-10-21: 1 via TOPICAL

## 2019-10-21 MED ORDER — ACETAMINOPHEN 10 MG/ML IV SOLN
1000.0000 mg | Freq: Once | INTRAVENOUS | Status: AC
  Administered 2019-10-21: 1000 mg via INTRAVENOUS
  Filled 2019-10-21: qty 100

## 2019-10-21 MED ORDER — ONDANSETRON HCL 4 MG/2ML IJ SOLN
INTRAMUSCULAR | Status: DC | PRN
  Administered 2019-10-21: 4 mg via INTRAVENOUS

## 2019-10-21 MED ORDER — KETOROLAC TROMETHAMINE 30 MG/ML IJ SOLN
INTRAMUSCULAR | Status: AC
  Filled 2019-10-21: qty 1

## 2019-10-21 MED ORDER — MIDAZOLAM HCL 2 MG/2ML IJ SOLN
INTRAMUSCULAR | Status: AC
  Filled 2019-10-21: qty 2

## 2019-10-21 MED ORDER — DIPHENHYDRAMINE HCL 12.5 MG/5ML PO ELIX: 12.5000 mg | ORAL_SOLUTION | ORAL | Status: DC | PRN

## 2019-10-21 MED ORDER — ALUM & MAG HYDROXIDE-SIMETH 200-200-20 MG/5ML PO SUSP
30.0000 mL | ORAL | Status: DC | PRN
  Administered 2019-10-22: 30 mL via ORAL
  Filled 2019-10-21: qty 30

## 2019-10-21 MED ORDER — AMISULPRIDE (ANTIEMETIC) 5 MG/2ML IV SOLN: 10.0000 mg | Freq: Once | INTRAVENOUS | Status: DC | PRN

## 2019-10-21 MED ORDER — LIDOCAINE HCL (CARDIAC) PF 100 MG/5ML IV SOSY
PREFILLED_SYRINGE | INTRAVENOUS | Status: DC | PRN
  Administered 2019-10-21: 60 mg via INTRAVENOUS

## 2019-10-21 MED ORDER — ACETAMINOPHEN 10 MG/ML IV SOLN: 1000.0000 mg | Freq: Once | INTRAVENOUS | Status: DC | PRN

## 2019-10-21 MED ORDER — ONDANSETRON HCL 4 MG PO TABS: 4.0000 mg | ORAL_TABLET | Freq: Four times a day (QID) | ORAL | Status: DC | PRN

## 2019-10-21 MED ORDER — CELECOXIB 200 MG PO CAPS
200.0000 mg | ORAL_CAPSULE | Freq: Two times a day (BID) | ORAL | Status: DC
  Administered 2019-10-21 – 2019-10-22 (×2): 200 mg via ORAL
  Filled 2019-10-21 (×2): qty 1

## 2019-10-21 MED ORDER — POVIDONE-IODINE 10 % EX SWAB: 2.0000 "application " | Freq: Once | CUTANEOUS | Status: DC

## 2019-10-21 MED ORDER — LABETALOL HCL 5 MG/ML IV SOLN
5.0000 mg | Freq: Once | INTRAVENOUS | Status: AC
  Administered 2019-10-21: 5 mg via INTRAVENOUS

## 2019-10-21 MED ORDER — HYDROMORPHONE HCL 1 MG/ML IJ SOLN: 0.2500 mg | INTRAMUSCULAR | Status: DC | PRN

## 2019-10-21 MED ORDER — ACETAMINOPHEN 325 MG PO TABS: 325.0000 mg | ORAL_TABLET | Freq: Once | ORAL | Status: DC | PRN

## 2019-10-21 MED ORDER — PROPOFOL 500 MG/50ML IV EMUL
INTRAVENOUS | Status: DC | PRN
  Administered 2019-10-21: 100 ug/kg/min via INTRAVENOUS

## 2019-10-21 MED ORDER — PHENYLEPHRINE HCL (PRESSORS) 10 MG/ML IV SOLN
INTRAVENOUS | Status: AC
  Filled 2019-10-21: qty 1

## 2019-10-21 MED ORDER — ISOPROPYL ALCOHOL 70 % SOLN
Status: AC
  Filled 2019-10-21: qty 480

## 2019-10-21 MED ORDER — SODIUM CHLORIDE (PF) 0.9 % IJ SOLN
INTRAMUSCULAR | Status: DC | PRN
  Administered 2019-10-21: 30 mL

## 2019-10-21 MED ORDER — METHOCARBAMOL 500 MG PO TABS
500.0000 mg | ORAL_TABLET | Freq: Four times a day (QID) | ORAL | Status: DC | PRN
  Administered 2019-10-21 (×2): 500 mg via ORAL
  Filled 2019-10-21 (×2): qty 1

## 2019-10-21 MED ORDER — DEXAMETHASONE SODIUM PHOSPHATE 10 MG/ML IJ SOLN
10.0000 mg | Freq: Once | INTRAMUSCULAR | Status: AC
  Administered 2019-10-22: 10 mg via INTRAVENOUS
  Filled 2019-10-21: qty 1

## 2019-10-21 MED ORDER — LABETALOL HCL 5 MG/ML IV SOLN
INTRAVENOUS | Status: AC
  Filled 2019-10-21: qty 4

## 2019-10-21 MED ORDER — FENTANYL CITRATE (PF) 100 MCG/2ML IJ SOLN
INTRAMUSCULAR | Status: DC | PRN
  Administered 2019-10-21: 100 ug via INTRAVENOUS

## 2019-10-21 MED ORDER — SENNA 8.6 MG PO TABS
1.0000 | ORAL_TABLET | Freq: Two times a day (BID) | ORAL | Status: DC
  Administered 2019-10-21 – 2019-10-22 (×2): 8.6 mg via ORAL
  Filled 2019-10-21 (×2): qty 1

## 2019-10-21 MED ORDER — METOCLOPRAMIDE HCL 5 MG/ML IJ SOLN: 5.0000 mg | Freq: Three times a day (TID) | INTRAMUSCULAR | Status: DC | PRN

## 2019-10-21 MED ORDER — ACETAMINOPHEN 325 MG PO TABS: 325.0000 mg | ORAL_TABLET | Freq: Four times a day (QID) | ORAL | Status: DC | PRN

## 2019-10-21 MED ORDER — PROPOFOL 1000 MG/100ML IV EMUL
INTRAVENOUS | Status: AC
  Filled 2019-10-21: qty 100

## 2019-10-21 MED ORDER — SODIUM CHLORIDE 0.9 % IV SOLN: INTRAVENOUS | Status: DC

## 2019-10-21 MED ORDER — SODIUM CHLORIDE 0.9 % IR SOLN
Status: DC | PRN
  Administered 2019-10-21: 500 mL

## 2019-10-21 MED ORDER — PHENOL 1.4 % MT LIQD: 1.0000 | OROMUCOSAL | Status: DC | PRN

## 2019-10-21 MED ORDER — PROPOFOL 10 MG/ML IV BOLUS
INTRAVENOUS | Status: DC | PRN
  Administered 2019-10-21: 20 mg via INTRAVENOUS

## 2019-10-21 MED ORDER — ACETAMINOPHEN 160 MG/5ML PO SOLN: 325.0000 mg | Freq: Once | ORAL | Status: DC | PRN

## 2019-10-21 MED ORDER — EPHEDRINE SULFATE-NACL 50-0.9 MG/10ML-% IV SOSY
PREFILLED_SYRINGE | INTRAVENOUS | Status: DC | PRN
  Administered 2019-10-21 (×3): 10 mg via INTRAVENOUS

## 2019-10-21 MED ORDER — MEPERIDINE HCL 50 MG/ML IJ SOLN: 6.2500 mg | INTRAMUSCULAR | Status: DC | PRN

## 2019-10-21 MED ORDER — BUPIVACAINE-EPINEPHRINE 0.25% -1:200000 IJ SOLN
INTRAMUSCULAR | Status: DC | PRN
  Administered 2019-10-21: 30 mL

## 2019-10-21 SURGICAL SUPPLY — 65 items
APL PRP STRL LF DISP 70% ISPRP (MISCELLANEOUS) ×1
BAG DECANTER FOR FLEXI CONT (MISCELLANEOUS) IMPLANT
BAG SPEC THK2 15X12 ZIP CLS (MISCELLANEOUS)
BAG ZIPLOCK 12X15 (MISCELLANEOUS) IMPLANT
BLADE SURG SZ10 CARB STEEL (BLADE) IMPLANT
CHLORAPREP W/TINT 26 (MISCELLANEOUS) ×2 IMPLANT
COVER PERINEAL POST (MISCELLANEOUS) ×2 IMPLANT
COVER SURGICAL LIGHT HANDLE (MISCELLANEOUS) ×2 IMPLANT
COVER WAND RF STERILE (DRAPES) ×2 IMPLANT
CUP ACET PNNCL SECTR W/GRIP 56 (Hips) ×1 IMPLANT
DECANTER SPIKE VIAL GLASS SM (MISCELLANEOUS) ×2 IMPLANT
DERMABOND ADVANCED (GAUZE/BANDAGES/DRESSINGS) ×2
DERMABOND ADVANCED .7 DNX12 (GAUZE/BANDAGES/DRESSINGS) ×2 IMPLANT
DRAPE IMP U-DRAPE 54X76 (DRAPES) ×2 IMPLANT
DRAPE SHEET LG 3/4 BI-LAMINATE (DRAPES) ×6 IMPLANT
DRAPE STERI IOBAN 125X83 (DRAPES) IMPLANT
DRAPE U-SHAPE 47X51 STRL (DRAPES) ×4 IMPLANT
DRSG AQUACEL AG ADV 3.5X10 (GAUZE/BANDAGES/DRESSINGS) ×2 IMPLANT
ELECT REM PT RETURN 15FT ADLT (MISCELLANEOUS) ×2 IMPLANT
GAUZE SPONGE 4X4 12PLY STRL (GAUZE/BANDAGES/DRESSINGS) ×2 IMPLANT
GLOVE BIO SURGEON STRL SZ7.5 (GLOVE) ×2 IMPLANT
GLOVE BIO SURGEON STRL SZ8.5 (GLOVE) ×4 IMPLANT
GLOVE BIOGEL PI IND STRL 7.5 (GLOVE) ×1 IMPLANT
GLOVE BIOGEL PI IND STRL 8.5 (GLOVE) ×1 IMPLANT
GLOVE BIOGEL PI INDICATOR 7.5 (GLOVE) ×1
GLOVE BIOGEL PI INDICATOR 8.5 (GLOVE) ×1
GOWN SPEC L3 XXLG W/TWL (GOWN DISPOSABLE) ×2 IMPLANT
GOWN STRL REUS W/ TWL LRG LVL3 (GOWN DISPOSABLE) ×1 IMPLANT
GOWN STRL REUS W/TWL LRG LVL3 (GOWN DISPOSABLE) ×2
HANDPIECE INTERPULSE COAX TIP (DISPOSABLE) ×2
HEAD CERAMIC DELTA 36 PLUS 1.5 (Hips) ×2 IMPLANT
HOLDER FOLEY CATH W/STRAP (MISCELLANEOUS) ×2 IMPLANT
HOOD PEEL AWAY FLYTE STAYCOOL (MISCELLANEOUS) ×8 IMPLANT
JET LAVAGE IRRISEPT WOUND (IRRIGATION / IRRIGATOR) ×2
KIT TURNOVER KIT A (KITS) IMPLANT
LAVAGE JET IRRISEPT WOUND (IRRIGATION / IRRIGATOR) ×1 IMPLANT
LINER NEUTRAL 52MMX36MMX56N (Liner) ×2 IMPLANT
MANIFOLD NEPTUNE II (INSTRUMENTS) ×2 IMPLANT
MARKER SKIN DUAL TIP RULER LAB (MISCELLANEOUS) ×2 IMPLANT
NDL SAFETY ECLIPSE 18X1.5 (NEEDLE) ×1 IMPLANT
NEEDLE HYPO 18GX1.5 SHARP (NEEDLE) ×2
NEEDLE SPNL 18GX3.5 QUINCKE PK (NEEDLE) ×2 IMPLANT
PACK ANTERIOR HIP CUSTOM (KITS) ×2 IMPLANT
PENCIL SMOKE EVACUATOR (MISCELLANEOUS) ×2 IMPLANT
PINN SECTOR W/GRIP ACE CUP 56 (Hips) ×2 IMPLANT
SAW OSC TIP CART 19.5X105X1.3 (SAW) ×2 IMPLANT
SEALER BIPOLAR AQUA 6.0 (INSTRUMENTS) ×2 IMPLANT
SET HNDPC FAN SPRY TIP SCT (DISPOSABLE) ×1 IMPLANT
STEM TRI LOC BPS SZ6 W GRIPTON ×1 IMPLANT
SUT ETHIBOND NAB CT1 #1 30IN (SUTURE) ×4 IMPLANT
SUT MNCRL AB 3-0 PS2 18 (SUTURE) ×2 IMPLANT
SUT MNCRL AB 4-0 PS2 18 (SUTURE) ×2 IMPLANT
SUT MON AB 2-0 CT1 36 (SUTURE) ×4 IMPLANT
SUT STRATAFIX PDO 1 14 VIOLET (SUTURE) ×2
SUT STRATFX PDO 1 14 VIOLET (SUTURE) ×1
SUT VIC AB 1 CTX 36 (SUTURE) ×2
SUT VIC AB 1 CTX36XBRD ANBCTR (SUTURE) ×1 IMPLANT
SUT VIC AB 2-0 CT1 27 (SUTURE) ×2
SUT VIC AB 2-0 CT1 TAPERPNT 27 (SUTURE) ×1 IMPLANT
SUTURE STRATFX PDO 1 14 VIOLET (SUTURE) ×1 IMPLANT
SYR 3ML LL SCALE MARK (SYRINGE) ×2 IMPLANT
TRAY FOLEY MTR SLVR 16FR STAT (SET/KITS/TRAYS/PACK) ×2 IMPLANT
TRI LOC BPS SZ 6 W GRIPTON ×2 IMPLANT
WATER STERILE IRR 1000ML POUR (IV SOLUTION) ×2 IMPLANT
YANKAUER SUCT BULB TIP 10FT TU (MISCELLANEOUS) ×2 IMPLANT

## 2019-10-21 NOTE — Transfer of Care (Signed)
Immediate Anesthesia Transfer of Care Note  Patient: Cory Cabrera.  Procedure(s) Performed: TOTAL HIP ARTHROPLASTY ANTERIOR APPROACH (Right Hip)  Patient Location: PACU  Anesthesia Type:General  Level of Consciousness: awake, alert , oriented and patient cooperative  Airway & Oxygen Therapy: Patient Spontanous Breathing and Patient connected to face mask oxygen  Post-op Assessment: Report given to RN and Post -op Vital signs reviewed and stable  Post vital signs: Reviewed and stable  Last Vitals:  Vitals Value Taken Time  BP 132/77 10/21/19 1138  Temp    Pulse 129 10/21/19 1139  Resp 18 10/21/19 1139  SpO2 99 % 10/21/19 1139  Vitals shown include unvalidated device data.  Last Pain:  Vitals:   10/21/19 0654  TempSrc:   PainSc: 4       Patients Stated Pain Goal: 3 (10/21/19 0654)  Complications: No complications documented.

## 2019-10-21 NOTE — Evaluation (Signed)
Physical Therapy Evaluation Patient Details Name: Cory Cabrera. MRN: [id number] DOB: [birth year] Today's Date: 10/21/2019   History of Present Illness  Patient is 44 y.o. male s/p Rt THA on 10/21/19 with PMH significant for HTN, GERD, OA, appendectomy.  Clinical Impression  Cory Desroches. is a 44 y.o. male POD 0 s/p Rt THA. Patient reports independence with mobility at baseline. Patient is now limited by functional impairments (see PT problem list below) and requires min assist for bed mobility and transfers with RW. Patient was limited to sit<>stand today due to symptomatic hypotension required max assist to return to supine (BP 81/47 initially, 87/55, and then 99/50 in trendelenburg). RN notified of hypotension. Patient will benefit from continued skilled PT interventions to address impairments and progress towards PLOF. Acute PT will follow to progress mobility and stair training in preparation for safe discharge home.       Follow Up Recommendations Follow surgeon's recommendation for DC plan and follow-up therapies    Equipment Recommendations  Rolling walker with 5" wheels;3in1 (PT)    Recommendations for Other Services       Precautions / Restrictions Precautions Precautions: Fall Restrictions Weight Bearing Restrictions: No      Mobility  Bed Mobility Overal bed mobility: Needs Assistance Bed Mobility: Supine to Sit;Sit to Supine     Supine to sit: Min assist;HOB elevated Sit to supine: Max assist;+2 for physical assistance;+2 for safety/equipment   General bed mobility comments: Cues for use of bed rail and assist to bring Rt LE off EOB. Max assist to return to supine as pt became lightheaded and diaphoretic. Pt hypotensive in supine.   Transfers Overall transfer level: Needs assistance Equipment used: Rolling walker (2 wheeled) Transfers: Sit to/from Stand Sit to Stand: Min assist;From elevated surface         General transfer comment: Pt required  min assist to steady with rising, pt stood for ~10 seconds and reports lightheadedness and returned to sitting.  Ambulation/Gait         Stairs       Wheelchair Mobility    Modified Rankin (Stroke Patients Only)       Balance Overall balance assessment: Needs assistance Sitting-balance support: Feet supported Sitting balance-Leahy Scale: Good     Standing balance support: During functional activity;Bilateral upper extremity supported Standing balance-Leahy Scale: Fair            Pertinent Vitals/Pain Pain Assessment: 0-10 Pain Score: 5  Pain Location: Rt hip Pain Descriptors / Indicators: Aching;Discomfort Pain Intervention(s): Limited activity within patient's tolerance;Monitored during session;Repositioned;Ice applied    Home Living Family/patient expects to be discharged to:: Private residence Living Arrangements: Spouse/significant other Available Help at Discharge: Family Type of Home: House Home Access: Level entry   Entrance Stairs-Number of Steps: level through the garage Home Layout: Two level;Full bath on main level;Able to live on main level with bedroom/bathroom;Bed/bath upstairs Home Equipment: Cane - single point      Prior Function Level of Independence: Independent with assistive device(s)         Comments: using SPC for ~ 2 years     Hand Dominance   Dominant Hand: Right    Extremity/Trunk Assessment   Upper Extremity Assessment Upper Extremity Assessment: Overall WFL for tasks assessed    Lower Extremity Assessment Lower Extremity Assessment: Overall WFL for tasks assessed    Cervical / Trunk Assessment Cervical / Trunk Assessment: Normal  Communication   Communication: No difficulties  Cognition Arousal/Alertness: Awake/alert Behavior  During Therapy: WFL for tasks assessed/performed Overall Cognitive Status: Within Functional Limits for tasks assessed         General Comments      Exercises     Assessment/Plan     PT Assessment Patient needs continued PT services  PT Problem List Decreased strength;Decreased range of motion;Decreased balance;Decreased mobility;Decreased activity tolerance;Decreased knowledge of use of DME;Decreased knowledge of precautions;Pain       PT Treatment Interventions DME instruction;Gait training;Stair training;Functional mobility training;Therapeutic activities;Therapeutic exercise;Balance training;Patient/family education    PT Goals (Current goals can be found in the Care Plan section)  Acute Rehab PT Goals Patient Stated Goal: get back to golfing and biking PT Goal Formulation: With patient Time For Goal Achievement: 10/28/19 Potential to Achieve Goals: Good    Frequency 7X/week    AM-PAC PT "6 Clicks" Mobility  Outcome Measure Help needed turning from your back to your side while in a flat bed without using bedrails?: A Little Help needed moving from lying on your back to sitting on the side of a flat bed without using bedrails?: A Little Help needed moving to and from a bed to a chair (including a wheelchair)?: A Little Help needed standing up from a chair using your arms (e.g., wheelchair or bedside chair)?: A Little Help needed to walk in hospital room?: A Lot Help needed climbing 3-5 steps with a railing? : A Lot 6 Click Score: 16    End of Session Equipment Utilized During Treatment: Gait belt Activity Tolerance: Treatment limited secondary to medical complications (Comment) Patient left: in bed;with call bell/phone within reach;with bed alarm set;with SCD's reapplied;with nursing/sitter in room Nurse Communication: Mobility status PT Visit Diagnosis: Muscle weakness (generalized) (M62.81);Difficulty in walking, not elsewhere classified (R26.2)    Time: 8850-2774 PT Time Calculation (min) (ACUTE ONLY): 25 min   Charges:   PT Evaluation $PT Eval Low Complexity: 1 Low PT Treatments $Therapeutic Activity: 8-22 mins        Wynn Maudlin,  DPT Acute Rehabilitation Services  Office [phone] Pager [phone]  10/21/2019 4:57 PM

## 2019-10-21 NOTE — Anesthesia Postprocedure Evaluation (Signed)
Anesthesia Post Note  Patient: Cory Cabrera.  Procedure(s) Performed: TOTAL HIP ARTHROPLASTY ANTERIOR APPROACH (Right Hip)     Patient location during evaluation: PACU Anesthesia Type: Spinal Level of consciousness: oriented and awake and alert Pain management: pain level controlled Vital Signs Assessment: post-procedure vital signs reviewed and stable Respiratory status: spontaneous breathing, respiratory function stable and patient connected to nasal cannula oxygen Cardiovascular status: blood pressure returned to baseline and stable Postop Assessment: no headache, no backache, no apparent nausea or vomiting and spinal receding Anesthetic complications: no   No complications documented.  Last Vitals:  Vitals:   10/21/19 1242 10/21/19 1305  BP: 120/74 123/85  Pulse: 84 84  Resp: 17 18  Temp:  36.7 C  SpO2: 99% 100%    Last Pain:  Vitals:   10/21/19 1305  TempSrc: Oral  PainSc:                  [name]

## 2019-10-21 NOTE — Discharge Instructions (Signed)
°Dr. Sabriyah Wilcher °Joint Replacement Specialist °[facility] °[address] °[place], [place] [address] °[phone] ° ° °TOTAL HIP REPLACEMENT POSTOPERATIVE DIRECTIONS ° ° ° °Hip Rehabilitation, Guidelines Following Surgery  ° °WEIGHT BEARING °Weight bearing as tolerated with assist device (walker, cane, etc) as directed, use it as long as suggested by your surgeon or therapist, typically at least 4-6 weeks. ° °The results of a hip operation are greatly improved after range of motion and muscle strengthening exercises. Follow all safety measures which are given to protect your hip. If any of these exercises cause increased pain or swelling in your joint, decrease the amount until you are comfortable again. Then slowly increase the exercises. Call your caregiver if you have problems or questions.  ° °HOME CARE INSTRUCTIONS  °Most of the following instructions are designed to prevent the dislocation of your new hip.  °Remove items at home which could result in a fall. This includes throw rugs or furniture in walking pathways.  °Continue medications as instructed at time of discharge. °· You may have some home medications which will be placed on hold until you complete the course of blood thinner medication. °· You may start showering once you are discharged home. Do not remove your dressing. °Do not put on socks or shoes without following the instructions of your caregivers.   °Sit on chairs with arms. Use the chair arms to help push yourself up when arising.  °Arrange for the use of a toilet seat elevator so you are not sitting low.  °· Walk with walker as instructed.  °You may resume a sexual relationship in one month or when given the OK by your caregiver.  °Use walker as long as suggested by your caregivers.  °You may put full weight on your legs and walk as much as is comfortable. °Avoid periods of inactivity such as sitting longer than an hour when not asleep. This helps prevent  blood clots.  °You may return to work once you are cleared by your surgeon.  °Do not drive a car for 6 weeks or until released by your surgeon.  °Do not drive while taking narcotics.  °Wear elastic stockings for two weeks following surgery during the day but you may remove then at night.  °Make sure you keep all of your appointments after your operation with all of your doctors and caregivers. You should call the office at the above phone number and make an appointment for approximately two weeks after the date of your surgery. °Please pick up a stool softener and laxative for home use as long as you are requiring pain medications. °· ICE to the affected hip every three hours for 30 minutes at a time and then as needed for pain and swelling. Continue to use ice on the hip for pain and swelling from surgery. You may notice swelling that will progress down to the foot and ankle.  This is normal after surgery.  Elevate the leg when you are not up walking on it.   °It is important for you to complete the blood thinner medication as prescribed by your doctor. °· Continue to use the breathing machine which will help keep your temperature down.  It is common for your temperature to cycle up and down following surgery, especially at night when you are not up moving around and exerting yourself.  The breathing machine keeps your lungs expanded and your temperature down. ° °RANGE OF MOTION AND STRENGTHENING EXERCISES  °These exercises are   designed to help you keep full movement of your hip joint. Follow your caregiver's or physical therapist's instructions. Perform all exercises about fifteen times, three times per day or as directed. Exercise both hips, even if you have had only one joint replacement. These exercises can be done on a training (exercise) mat, on the floor, on a table or on a bed. Use whatever works the best and is most comfortable for you. Use music or television while you are exercising so that the exercises  are a pleasant break in your day. This will make your life better with the exercises acting as a break in routine you can look forward to.  °Lying on your back, slowly slide your foot toward your buttocks, raising your knee up off the floor. Then slowly slide your foot back down until your leg is straight again.  °Lying on your back spread your legs as far apart as you can without causing discomfort.  °Lying on your side, raise your upper leg and foot straight up from the floor as far as is comfortable. Slowly lower the leg and repeat.  °Lying on your back, tighten up the muscle in the front of your thigh (quadriceps muscles). You can do this by keeping your leg straight and trying to raise your heel off the floor. This helps strengthen the largest muscle supporting your knee.  °Lying on your back, tighten up the muscles of your buttocks both with the legs straight and with the knee bent at a comfortable angle while keeping your heel on the floor.  ° °SKILLED REHAB INSTRUCTIONS: °If the patient is transferred to a skilled rehab facility following release from the hospital, a list of the current medications will be sent to the facility for the patient to continue.  When discharged from the skilled rehab facility, please have the facility set up the patient's Home Health Physical Therapy prior to being released. Also, the skilled facility will be responsible for providing the patient with their medications at time of release from the facility to include their pain medication and their blood thinner medication. If the patient is still at the rehab facility at time of the two week follow up appointment, the skilled rehab facility will also need to assist the patient in arranging follow up appointment in our office and any transportation needs. ° °MAKE SURE YOU:  °Understand these instructions.  °Will watch your condition.  °Will get help right away if you are not doing well or get worse. ° °Pick up stool softner and  laxative for home use following surgery while on pain medications. °Do not remove your dressing. °The dressing is waterproof--it is OK to take showers. °Continue to use ice for pain and swelling after surgery. °Do not use any lotions or creams on the incision until instructed by your surgeon. °Total Hip Protocol. ° ° °

## 2019-10-21 NOTE — Interval H&P Note (Signed)
History and Physical Interval Note:  10/21/2019 8:23 AM  Cory Cabrera.  has presented today for surgery, with the diagnosis of Post tramatic right hip arthritis.  The various methods of treatment have been discussed with the patient and family. After consideration of risks, benefits and other options for treatment, the patient has consented to  Procedure(s): TOTAL HIP ARTHROPLASTY ANTERIOR APPROACH (Right) as a surgical intervention.  The patient's history has been reviewed, patient examined, no change in status, stable for surgery.  I have reviewed the patient's chart and labs.  Questions were answered to the patient's satisfaction.     [name]

## 2019-10-21 NOTE — Plan of Care (Signed)
Plan of care Problem: Education: Goal: Knowledge of General Education information will improve Description: Including pain rating scale, medication(s)/side effects and non-pharmacologic comfort measures Outcome: Progressing   Problem: Activity: Goal: Risk for activity intolerance will decrease Outcome: Progressing   Problem: Pain Managment: Goal: General experience of comfort will improve Outcome: Progressing

## 2019-10-21 NOTE — Op Note (Signed)
OPERATIVE REPORT  SURGEON: Rod Can, MD   ASSISTANT: Cherlynn June, PA-C. Nehemiah Massed, PA-C.  PREOPERATIVE DIAGNOSIS: Posttraumatic Right hip arthritis.   POSTOPERATIVE DIAGNOSIS: Posttraumatic Right hip arthritis.   PROCEDURE: Right total hip arthroplasty, anterior approach.   IMPLANTS: DePuy Tri Lock stem, size 6, hi offset. DePuy Pinnacle Cup, size 56 mm. DePuy Altrx liner, size 36 by 56 mm, neutral. DePuy Biolox ceramic head ball, size 36 + 1.5 mm.  ANESTHESIA:  MAC and Spinal  ESTIMATED BLOOD LOSS:-400 mL    ANTIBIOTICS: 2g Ancef.  DRAINS: None.  COMPLICATIONS: None.   CONDITION: PACU - hemodynamically stable.   BRIEF CLINICAL NOTE: Cory Vogelsang. is a 44 y.o. male with a long-standing history of Right hip arthritis. After failing conservative management, the patient was indicated for total hip arthroplasty. The risks, benefits, and alternatives to the procedure were explained, and the patient elected to proceed.  PROCEDURE IN DETAIL: Surgical site was marked by myself in the pre-op holding area. Once inside the operating room, spinal anesthesia was obtained, and a foley catheter was inserted. The patient was then positioned on the Hana table.  All bony prominences were well padded.  The hip was prepped and draped in the normal sterile surgical fashion.  A time-out was called verifying side and site of surgery. The patient received IV antibiotics within 60 minutes of beginning the procedure.   The direct anterior approach to the hip was performed through the Hueter interval.  Lateral femoral circumflex vessels were treated with the Auqumantys. The anterior capsule was exposed and an inverted T capsulotomy was made. The femoral neck was malformed and had significant osteophytes. The femoral neck cut was made to the level of the templated cut.  A corkscrew was placed into the head and the head was removed.  The femoral head was found to have eburnated  bone. The head was passed to the back table and was measured.   Acetabular exposure was achieved, and the pulvinar and labrum were excised. Sequential reaming of the acetabulum was then performed up to a size 55 mm reamer. A 56 mm cup was then opened and impacted into place at approximately 40 degrees of abduction and 20 degrees of anteversion. The final polyethylene liner was impacted into place and acetabular osteophytes were removed.    I then gained femoral exposure taking care to protect the abductors and greater trochanter.  This was performed using standard external rotation, extension, and adduction.  The capsule was peeled off the inner aspect of the greater trochanter, taking care to preserve the short external rotators. A cookie cutter was used to enter the femoral canal, and then the femoral canal finder was placed.  Sequential broaching was performed up to a size 6.  Calcar planer was used on the femoral neck remnant.  I placed a hi offset neck and a trial head ball.  The hip was reduced.  Leg lengths and offset were checked fluoroscopically.  The hip was dislocated and trial components were removed.  The final implants were placed, and the hip was reduced.  Fluoroscopy was used to confirm component position and leg lengths.  At 90 degrees of external rotation and full extension, the hip was stable to an anterior directed force.   The wound was copiously irrigated with Irrisept solution and normal saline using pule lavage.  Marcaine solution was injected into the periarticular soft tissue.  The wound was closed in layers using #1 Stratafix for the fascia, 2-0 Vicryl for  the subcutaneous fat, 2-0 Monocryl for the deep dermal layer, 3-0 running Monocryl subcuticular stitch, and Dermabond for the skin.  Once the glue was fully dried, an Aquacell Ag dressing was applied.  The patient was transported to the recovery room in stable condition.  Sponge, needle, and instrument counts were correct at the  end of the case x2.  The patient tolerated the procedure well and there were no known complications.  Please note that a surgical assistant was a medical necessity for this procedure to perform it in a safe and expeditious manner. Assistant was necessary to provide appropriate retraction of vital neurovascular structures, to prevent femoral fracture, and to allow for anatomic placement of the prosthesis.

## 2019-10-21 NOTE — Anesthesia Procedure Notes (Signed)
Spinal  Start time: 10/21/2019 8:39 AM End time: 10/21/2019 8:41 AM Staffing Performed: anesthesiologist  Anesthesiologist: [name], MD Preanesthetic Checklist Completed: patient identified, IV checked, site marked, risks and benefits discussed, surgical consent, monitors and equipment checked, pre-op evaluation and timeout performed Spinal Block Patient position: sitting Prep: DuraPrep and site prepped and draped Location: L3-4 Injection technique: single-shot Needle Needle type: Pencan  Needle gauge: 24 G Needle length: 10 cm Needle insertion depth: 10 cm Additional Notes Patient tolerated well. No immediate complications.

## 2019-10-21 NOTE — Anesthesia Preprocedure Evaluation (Signed)
Anesthesia Evaluation  Patient identified by MRN, date of birth, ID band Patient awake    Reviewed: Allergy & Precautions, NPO status , Patient's Chart, lab work & pertinent test results  Airway Mallampati: I  TM Distance: >3 FB Neck ROM: Full    Dental  (+) Teeth Intact, Dental Advisory Given   Pulmonary sleep apnea ,    breath sounds clear to auscultation       Cardiovascular hypertension,  Rhythm:Regular Rate:Normal     Neuro/Psych negative neurological ROS  negative psych ROS   GI/Hepatic Neg liver ROS, GERD  ,  Endo/Other  negative endocrine ROS  Renal/GU negative Renal ROS     Musculoskeletal  (+) Arthritis ,   Abdominal (+) + obese,   Peds  Hematology negative hematology ROS (+)   Anesthesia Other Findings   Reproductive/Obstetrics                             Anesthesia Physical Anesthesia Plan  ASA: II  Anesthesia Plan: Spinal   Post-op Pain Management:    Induction: Intravenous  PONV Risk Score and Plan: 2 and Ondansetron, Propofol infusion and Midazolam  Airway Management Planned: Natural Airway and Simple Face Mask  Additional Equipment: None  Intra-op Plan:   Post-operative Plan:   Informed Consent: I have reviewed the patients History and Physical, chart, labs and discussed the procedure including the risks, benefits and alternatives for the proposed anesthesia with the patient or authorized representative who has indicated his/her understanding and acceptance.       Plan Discussed with: CRNA  Anesthesia Plan Comments: (Lab Results      Component                Value               Date                      WBC                      4.9                 10/12/2019                HGB                      15.3                10/12/2019                HCT                      45.1                10/12/2019                MCV                      100.4 (H)            10/12/2019                PLT                      229                 10/12/2019           )  Anesthesia Quick Evaluation  

## 2019-10-22 ENCOUNTER — Encounter (HOSPITAL_COMMUNITY): Payer: Self-pay | Admitting: Orthopedic Surgery

## 2019-10-22 DIAGNOSIS — M1611 Unilateral primary osteoarthritis, right hip: Secondary | ICD-10-CM | POA: Diagnosis not present

## 2019-10-22 LAB — BASIC METABOLIC PANEL
Anion gap: 4 — ABNORMAL LOW (ref 5–15)
BUN: 12 mg/dL (ref 6–20)
CO2: 25 mmol/L (ref 22–32)
Calcium: 8.3 mg/dL — ABNORMAL LOW (ref 8.9–10.3)
Chloride: 106 mmol/L (ref 98–111)
Creatinine, Ser: 0.94 mg/dL (ref 0.61–1.24)
GFR calc Af Amer: >60 mL/min (ref >60)
GFR calc non Af Amer: >60 mL/min (ref >60)
Glucose, Bld: 165 mg/dL — ABNORMAL HIGH (ref 70–99)
Potassium: 4.3 mmol/L (ref 3.5–5.1)
Sodium: 135 mmol/L (ref 135–145)

## 2019-10-22 LAB — CBC
HCT: 35 % — ABNORMAL LOW (ref 39.0–52.0)
Hemoglobin: 12 g/dL — ABNORMAL LOW (ref 13.0–17.0)
MCH: 34.3 pg — ABNORMAL HIGH (ref 26.0–34.0)
MCHC: 34.3 g/dL (ref 30.0–36.0)
MCV: 100 fL (ref 80.0–100.0)
Platelets: 204 10*3/uL (ref 150–400)
RBC: 3.5 MIL/uL — ABNORMAL LOW (ref 4.22–5.81)
RDW: 12.2 % (ref 11.5–15.5)
WBC: 14.7 10*3/uL — ABNORMAL HIGH (ref 4.0–10.5)
nRBC: 0 % (ref 0.0–0.2)

## 2019-10-22 MED ORDER — DOCUSATE SODIUM 100 MG PO CAPS: 100.0000 mg | ORAL_CAPSULE | Freq: Two times a day (BID) | ORAL | 0 refills | Status: DC

## 2019-10-22 MED ORDER — HYDROCODONE-ACETAMINOPHEN 5-325 MG PO TABS: 1.0000 | ORAL_TABLET | ORAL | 0 refills | Status: AC | PRN

## 2019-10-22 MED ORDER — ASPIRIN 81 MG PO CHEW: 81.0000 mg | CHEWABLE_TABLET | Freq: Two times a day (BID) | ORAL | 0 refills | Status: AC

## 2019-10-22 MED ORDER — SENNA 8.6 MG PO TABS: 1.0000 | ORAL_TABLET | Freq: Two times a day (BID) | ORAL | 0 refills | Status: DC

## 2019-10-22 MED ORDER — ONDANSETRON HCL 4 MG PO TABS: 4.0000 mg | ORAL_TABLET | Freq: Four times a day (QID) | ORAL | 0 refills | Status: DC | PRN

## 2019-10-22 MED ORDER — SODIUM CHLORIDE 0.9 % IV BOLUS
1000.0000 mL | Freq: Once | INTRAVENOUS | Status: AC
  Administered 2019-10-22: 1000 mL via INTRAVENOUS

## 2019-10-22 NOTE — TOC Transition Note (Signed)
Transition of Care [facility]) - CM/SW Discharge Note   Patient Details  Name: Cory Cabrera. MRN: [id number] Date of Birth: 09-01-1975  [facility] of [facility]) CM/SW Contact:  Lennart Pall, LCSW Phone Number: 10/22/2019, 10:17 AM   Clinical Narrative:    Met briefly to confirm receipt of DME.  Home with HEP.  No further TOC needs.   Final next level of care: Home/Self Care Barriers to Discharge: No Barriers Identified   Patient Goals and CMS Choice        Discharge Placement                       Discharge Plan and Services                DME Arranged: Walker rolling DME Agency: [employer] Date DME Agency Contacted: 10/22/19 Time DME Agency Contacted: 0900 Representative spoke with at DME Agency: Ovid Curd HH Arranged: NA [place] Agency: NA        Social Determinants of Health ([place]) Interventions     Readmission Risk Interventions No flowsheet data found.

## 2019-10-22 NOTE — Progress Notes (Signed)
Physical Therapy Treatment Patient Details Name: Cory Cabrera. MRN: [id number] DOB: 1975/05/01 Today's Date: 10/22/2019    History of Present Illness Patient is 44 y.o. male s/p Rt THA on 10/21/19 with PMH significant for HTN, GERD, OA, appendectomy.    PT Comments    Pt motivated and progressing well with mobility.  Pt ambulated increased distance in hall, negotiated stairs and performed HEP with assist - written instruction provided and reviewed.  Spouse present for session but involved in working on computer and conducting Zoom call.   Follow Up Recommendations  Follow surgeon's recommendation for DC plan and follow-up therapies     Equipment Recommendations  Rolling walker with 5" wheels;3in1 (PT)    Recommendations for Other Services       Precautions / Restrictions Precautions Precautions: Fall Restrictions Weight Bearing Restrictions: No    Mobility  Bed Mobility Overal bed mobility: Needs Assistance Bed Mobility: Supine to Sit;Sit to Supine     Supine to sit: Min guard;Supervision Sit to supine: Min guard;Supervision   General bed mobility comments: cues for sequence and use of L LE to self assist  Transfers Overall transfer level: Needs assistance Equipment used: Rolling walker (2 wheeled) Transfers: Sit to/from Stand Sit to Stand: Min guard;Supervision         General transfer comment: cues for LE management and use of UEs to self assist  Ambulation/Gait Ambulation/Gait assistance: Min guard;Supervision Gait Distance (Feet): 350 Feet Assistive device: Rolling walker (2 wheeled) Gait Pattern/deviations: Step-to pattern;Step-through pattern;Decreased step length - right;Decreased step length - left;Shuffle;Trunk flexed Gait velocity: decr   General Gait Details: cues for sequence, posture and position from RW   Stairs Stairs: Yes Stairs assistance: Min guard Stair Management: One rail Right;Step to pattern;Forwards;With cane Number of  Stairs: 5 General stair comments: cues for sequence and foot/cane placement   Wheelchair Mobility    Modified Rankin (Stroke Patients Only)       Balance Overall balance assessment: Needs assistance Sitting-balance support: Feet supported Sitting balance-Leahy Scale: Good     Standing balance support: During functional activity;Bilateral upper extremity supported Standing balance-Leahy Scale: Fair                              Cognition Arousal/Alertness: Awake/alert Behavior During Therapy: WFL for tasks assessed/performed Overall Cognitive Status: Within Functional Limits for tasks assessed                                        Exercises Total Joint Exercises Ankle Circles/Pumps: AROM;Both;15 reps;Supine Quad Sets: AROM;Both;10 reps;Supine Heel Slides: AAROM;Right;20 reps;Supine Hip ABduction/ADduction: AAROM;Right;15 reps;Supine Long Arc Quad: AROM;Right;10 reps;Seated    General Comments        Pertinent Vitals/Pain Pain Assessment: 0-10 Pain Score: 4  Pain Location: Rt hip Pain Descriptors / Indicators: Aching;Discomfort Pain Intervention(s): Limited activity within patient's tolerance;Monitored during session;Premedicated before session;Ice applied    Home Living                      Prior Function            PT Goals (current goals can now be found in the care plan section) Acute Rehab PT Goals Patient Stated Goal: get back to golfing and biking PT Goal Formulation: With patient Time For Goal Achievement: 10/28/19 Potential to Achieve Goals: Good Progress  towards PT goals: Progressing toward goals    Frequency    7X/week      PT Plan Current plan remains appropriate    Co-evaluation              AM-PAC PT "6 Clicks" Mobility   Outcome Measure  Help needed turning from your back to your side while in a flat bed without using bedrails?: A Little Help needed moving from lying on your back to  sitting on the side of a flat bed without using bedrails?: A Little Help needed moving to and from a bed to a chair (including a wheelchair)?: A Little Help needed standing up from a chair using your arms (e.g., wheelchair or bedside chair)?: A Little Help needed to walk in hospital room?: A Little Help needed climbing 3-5 steps with a railing? : A Little 6 Click Score: 18    End of Session Equipment Utilized During Treatment: Gait belt Activity Tolerance: Patient tolerated treatment well Patient left: with call bell/phone within reach;Other (comment) (sitting EOB with spouse) Nurse Communication: Mobility status PT Visit Diagnosis: Muscle weakness (generalized) (M62.81);Difficulty in walking, not elsewhere classified (R26.2)     Time: 6433-2951 PT Time Calculation (min) (ACUTE ONLY): 32 min  Charges:  $Gait Training: 8-22 mins $Therapeutic Exercise: 8-22 mins                     Mauro Kaufmann PT Acute Rehabilitation Services Pager [phone] Office [phone]    Cory Cabrera 10/22/2019, 4:30 PM

## 2019-10-22 NOTE — Progress Notes (Signed)
Physical Therapy Treatment Patient Details Name: Cory Cabrera. MRN: [id number] DOB: June 03, 1975 Today's Date: 10/22/2019    History of Present Illness Patient is 44 y.o. male s/p Rt THA on 10/21/19 with PMH significant for HTN, GERD, OA, appendectomy.    PT Comments    Marked improvement in activity tolerance with no c/o dizziness.  Pt hopeful for dc home this pm.   Follow Up Recommendations  Follow surgeon's recommendation for DC plan and follow-up therapies     Equipment Recommendations  Rolling walker with 5" wheels;3in1 (PT)    Recommendations for Other Services       Precautions / Restrictions Precautions Precautions: Fall Restrictions Weight Bearing Restrictions: No    Mobility  Bed Mobility Overal bed mobility: Needs Assistance Bed Mobility: Supine to Sit     Supine to sit: Min guard     General bed mobility comments: cues for sequence and use of L LE to self assist  Transfers Overall transfer level: Needs assistance Equipment used: Rolling walker (2 wheeled) Transfers: Sit to/from Stand Sit to Stand: Min guard         General transfer comment: cues for LE management and use of UEs to self assist  Ambulation/Gait Ambulation/Gait assistance: Min assist;Min guard Gait Distance (Feet): 240 Feet Assistive device: Rolling walker (2 wheeled) Gait Pattern/deviations: Step-to pattern;Step-through pattern;Decreased step length - right;Decreased step length - left;Shuffle;Trunk flexed Gait velocity: decr   General Gait Details: cues for sequence, posture and position from [employer]             Wheelchair Mobility    Modified Rankin (Stroke Patients Only)       Balance Overall balance assessment: Needs assistance Sitting-balance support: Feet supported Sitting balance-Leahy Scale: Good     Standing balance support: During functional activity;Bilateral upper extremity supported Standing balance-Leahy Scale: Fair                               Cognition Arousal/Alertness: Awake/alert Behavior During Therapy: WFL for tasks assessed/performed Overall Cognitive Status: Within Functional Limits for tasks assessed                                        Exercises Total Joint Exercises Ankle Circles/Pumps: AROM;Both;15 reps;Supine Quad Sets: AROM;Both;10 reps;Supine Heel Slides: AAROM;Right;20 reps;Supine Hip ABduction/ADduction: AAROM;Right;15 reps;Supine    General Comments        Pertinent Vitals/Pain Pain Assessment: 0-10 Pain Score: 5  Pain Location: Rt hip Pain Descriptors / Indicators: Aching;Discomfort Pain Intervention(s): Limited activity within patient's tolerance;Monitored during session;Premedicated before session;Ice applied    Home Living                      Prior Function            PT Goals (current goals can now be found in the care plan section) Acute Rehab PT Goals Patient Stated Goal: get back to golfing and biking PT Goal Formulation: With patient Time For Goal Achievement: 10/28/19 Potential to Achieve Goals: Good Progress towards PT goals: Progressing toward goals    Frequency    7X/week      PT Plan Current plan remains appropriate    Co-evaluation              AM-PAC PT "6 Clicks" Mobility   Outcome Measure  Help needed turning from your back to your side while in a flat bed without using bedrails?: A Little Help needed moving from lying on your back to sitting on the side of a flat bed without using bedrails?: A Little Help needed moving to and from a bed to a chair (including a wheelchair)?: A Little Help needed standing up from a chair using your arms (e.g., wheelchair or bedside chair)?: A Little Help needed to walk in hospital room?: A Little Help needed climbing 3-5 steps with a railing? : A Little 6 Click Score: 18    End of Session Equipment Utilized During Treatment: Gait belt Activity Tolerance: Patient  tolerated treatment well Patient left: in chair;with call bell/phone within reach Nurse Communication: Mobility status PT Visit Diagnosis: Muscle weakness (generalized) (M62.81);Difficulty in walking, not elsewhere classified (R26.2)     Time: 5397-6734 PT Time Calculation (min) (ACUTE ONLY): 28 min  Charges:  $Gait Training: 8-22 mins $Therapeutic Exercise: 8-22 mins                     Cory Cabrera PT Acute Rehabilitation Services Pager [phone] Office [phone]    Cory Cabrera 10/22/2019, 12:49 PM

## 2019-10-22 NOTE — Plan of Care (Signed)
°  Problem: Education: Goal: Knowledge of General Education information will improve Description: Including pain rating scale, medication(s)/side effects and non-pharmacologic comfort measures Outcome: Adequate for Discharge   Problem: Health Behavior/Discharge Planning: Goal: Ability to manage health-related needs will improve Outcome: Adequate for Discharge   Problem: Clinical Measurements: Goal: Ability to maintain clinical measurements within normal limits will improve Outcome: Adequate for Discharge   Problem: Clinical Measurements: Goal: Will remain free from infection Outcome: Adequate for Discharge   Problem: Clinical Measurements: Goal: Diagnostic test results will improve Outcome: Adequate for Discharge   Problem: Clinical Measurements: Goal: Respiratory complications will improve Outcome: Adequate for Discharge   Problem: Clinical Measurements: Goal: Cardiovascular complication will be avoided Outcome: Adequate for Discharge   Problem: Activity: Goal: Risk for activity intolerance will decrease Outcome: Adequate for Discharge   Problem: Nutrition: Goal: Adequate nutrition will be maintained Outcome: Adequate for Discharge   Problem: Coping: Goal: Level of anxiety will decrease Outcome: Adequate for Discharge   Problem: Pain Managment: Goal: General experience of comfort will improve Outcome: Adequate for Discharge   Problem: Education: Goal: Knowledge of the prescribed therapeutic regimen will improve Outcome: Adequate for Discharge   Problem: Education: Goal: Understanding of discharge needs will improve Outcome: Adequate for Discharge   Problem: Education: Goal: Individualized Educational Video(s) Outcome: Adequate for Discharge   Problem: Clinical Measurements: Goal: Postoperative complications will be avoided or minimized Outcome: Adequate for Discharge   Problem: Pain Management: Goal: Pain level will decrease with appropriate  interventions Outcome: Adequate for Discharge   Problem: Acute Rehab PT Goals(only PT should resolve) Goal: Pt Will Go Supine/Side To Sit Outcome: Adequate for Discharge   Problem: Acute Rehab PT Goals(only PT should resolve) Goal: Pt/caregiver will Perform Home Exercise Program Outcome: Adequate for Discharge

## 2019-10-22 NOTE — Discharge Summary (Signed)
Physician Discharge Summary  Patient ID: Cory Cabrera. MRN: [id number] DOB/AGE: 10-26-1975 43 y.o.  Admit date: 10/21/2019 Discharge date: 10/22/2019  Admission Diagnoses:  Osteoarthritis of right knee  Discharge Diagnoses:  Principal Problem:   Osteoarthritis of right knee Active Problems:   Osteoarthritis of right hip   Past Medical History:  Diagnosis Date  . Arthritis   . GERD (gastroesophageal reflux disease)    No longer issue  . History of blood clots    coughed up  . Hypertension   . Sleep apnea     Surgeries: Procedure(s): TOTAL HIP ARTHROPLASTY ANTERIOR APPROACH on 10/21/2019   Consultants (if any):   Discharged Condition: Improved  Hospital Course: Cory Dreisbach. is an 44 y.o. male who was admitted 10/21/2019 with a diagnosis of Osteoarthritis of right knee and went to the operating room on 10/21/2019 and underwent the above named procedures.    He was given perioperative antibiotics:  Anti-infectives (From admission, onward)   Start     Dose/Rate Route Frequency Ordered Stop   10/21/19 1400  ceFAZolin (ANCEF) IVPB 2g/100 mL premix        2 g 200 mL/hr over 30 Minutes Intravenous Every 6 hours 10/21/19 1308 10/21/19 2028   10/21/19 0600  ceFAZolin (ANCEF) 3 g in dextrose 5 % 50 mL IVPB        3 g 100 mL/hr over 30 Minutes Intravenous On call to O.R. 10/20/19 4174 10/21/19 0814    .  He was given sequential compression devices, early ambulation, and aspirin for DVT prophylaxis.  He benefited maximally from the hospital stay and there were no complications.    Recent vital signs:  Vitals:   10/22/19 0124 10/22/19 0500  BP: 123/72 118/78  Pulse: 70 66  Resp: 15 14  Temp: 98.2 F (36.8 C) 98.2 F (36.8 C)  SpO2: 97% 99%    Recent laboratory studies:  Lab Results  Component Value Date   HGB 12.0 (L) 10/22/2019   HGB 15.3 10/12/2019   Lab Results  Component Value Date   WBC 14.7 (H) 10/22/2019   PLT 204 10/22/2019   Lab Results   Component Value Date   INR 1.0 10/12/2019   Lab Results  Component Value Date   NA 135 10/22/2019   K 4.3 10/22/2019   CL 106 10/22/2019   CO2 25 10/22/2019   BUN 12 10/22/2019   CREATININE 0.94 10/22/2019   GLUCOSE 165 (H) 10/22/2019    Discharge Medications:   Allergies as of 10/22/2019      Reactions   Iohexol Nausea And Vomiting   Pt vomited after contrast injection      Medication List    TAKE these medications   aspirin 81 MG chewable tablet Chew 1 tablet (81 mg total) by mouth 2 (two) times daily.   docusate sodium 100 MG capsule Commonly known as: COLACE Take 1 capsule (100 mg total) by mouth 2 (two) times daily.   HYDROcodone-acetaminophen 5-325 MG tablet Commonly known as: NORCO/VICODIN Take 1 tablet by mouth every 4 (four) hours as needed for up to 7 days for moderate pain.   losartan-hydrochlorothiazide 100-12.5 MG tablet Commonly known as: HYZAAR Take 1 tablet by mouth daily.   ondansetron 4 MG tablet Commonly known as: ZOFRAN Take 1 tablet (4 mg total) by mouth every 6 (six) hours as needed for nausea.   senna 8.6 MG Tabs tablet Commonly known as: SENOKOT Take 1 tablet (8.6 mg total) by mouth 2 (  two) times daily.       Diagnostic Studies: DG Pelvis Portable  Result Date: 10/21/2019 CLINICAL DATA:  Post of anterior hip replacement. EXAM: PORTABLE PELVIS 1-2 VIEWS COMPARISON:  Intraoperative radiographs same date. FINDINGS: 1153 hours. Status post right total hip arthroplasty. The hardware is well positioned. No evidence of acute fracture or dislocation. A small amount of gas is present within the soft tissue surrounding the right hip. Mild degenerative changes are present at the left hip and in the lower lumbar spine. IMPRESSION: No demonstrated complication following right total hip arthroplasty. Electronically Signed   By: Carey Bullocks M.D.   On: 10/21/2019 12:12   DG C-Arm 1-60 Min-No Report  Result Date: 10/21/2019 Fluoroscopy was utilized by  the requesting physician.  No radiographic interpretation.   DG HIP OPERATIVE UNILAT W OR W/O PELVIS RIGHT  Result Date: 10/21/2019 CLINICAL DATA:  Status post total hip replacement on the right EXAM: OPERATIVE RIGHT HIP (WITH PELVIS IF PERFORMED) 2 VIEWS TECHNIQUE: Fluoroscopic spot image(s) were submitted for interpretation post-operatively. FLUOROSCOPY TIME:  0 minutes 18 seconds; 8 acquired images COMPARISON:  None. FINDINGS: Initially, frontal view demonstrated advanced osteoarthritic change in the right hip joint. No fracture or dislocation. Slight narrowing left hip joint noted. Subsequent frontal images demonstrate placement of total hip replacement with prosthetic components on the right well seated. No fracture or dislocation. IMPRESSION: Total hip replacement performed on the right with prosthetic components well-seated on frontal imaging. No fracture or dislocation. Slight narrowing left hip joint. Electronically Signed   By: Bretta Bang III M.D.   On: 10/21/2019 10:49    Disposition: Discharge disposition: 01-Home or Self Care       Discharge Instructions    Call MD / Call 911   Complete by: As directed    If you experience chest pain or shortness of breath, CALL 911 and be transported to the hospital emergency room.  If you develope a fever above 101 F, pus (white drainage) or increased drainage or redness at the wound, or calf pain, call your surgeon's office.   Constipation Prevention   Complete by: As directed    Drink plenty of fluids.  Prune juice may be helpful.  You may use a stool softener, such as Colace (over the counter) 100 mg twice a day.  Use MiraLax (over the counter) for constipation as needed.   Driving restrictions   Complete by: As directed    No driving for 6 weeks   Increase activity slowly as tolerated   Complete by: As directed    TED hose   Complete by: As directed    Use stockings (TED hose) for 2 weeks on both leg(s).  You may remove them at  night for sleeping.       Follow-up Information    Swinteck, Arlys John, MD. Schedule an appointment as soon as possible for a visit in 2 weeks.   Specialty: Orthopedic Surgery Why: For wound re-check Contact information: [address] [place] [address] [phone]                Signed: Darrick Grinder 10/22/2019, 8:06 AM

## 2019-10-22 NOTE — Progress Notes (Signed)
    Subjective:  Patient reports pain as mild to moderate.  Denies N/V/CP/SOB. Patient is resting in bed  Objective:   VITALS:   Vitals:   10/21/19 1745 10/21/19 2058 10/22/19 0124 10/22/19 0500  BP: 119/68 127/69 123/72 118/78  Pulse: 77 87 70 66  Resp: 16 15 15 14   Temp: 97.8 F (36.6 C) 97.9 F (36.6 C) 98.2 F (36.8 C) 98.2 F (36.8 C)  TempSrc: Oral Oral Oral Oral  SpO2: 99% 95% 97% 99%  Weight:      Height:        NAD ABD soft Neurovascular intact Sensation intact distally Intact pulses distally Dorsiflexion/Plantar flexion intact Incision: dressing C/D/I   Lab Results  Component Value Date   WBC 14.7 (H) 10/22/2019   HGB 12.0 (L) 10/22/2019   HCT 35.0 (L) 10/22/2019   MCV 100.0 10/22/2019   PLT 204 10/22/2019   BMET    Component Value Date/Time   NA 135 10/22/2019 0300   K 4.3 10/22/2019 0300   CL 106 10/22/2019 0300   CO2 25 10/22/2019 0300   GLUCOSE 165 (H) 10/22/2019 0300   BUN 12 10/22/2019 0300   CREATININE 0.94 10/22/2019 0300   CALCIUM 8.3 (L) 10/22/2019 0300   GFRNONAA >60 10/22/2019 0300   GFRAA >60 10/22/2019 0300     Assessment/Plan: 1 Day Post-Op   Principal Problem:   Osteoarthritis of right knee Active Problems:   Osteoarthritis of right hip   WBAT with walker DVT ppx: Aspirin, SCDs, TEDS PO pain control Patient was dizzy when standing for PT.  1.0L saline bolus ordered PT/OT Dispo: Plan D/C home with home exercise plan     12/22/2019 10/22/2019, 8:00 AM  [facility] is now [facility] [address], [place], [place] Phone: [phone] [email or web address] Facebook  [phone]

## 2021-02-23 ENCOUNTER — Ambulatory Visit: Payer: Self-pay | Admitting: Internal Medicine

## 2021-03-07 ENCOUNTER — Ambulatory Visit: Payer: Self-pay | Admitting: Internal Medicine

## 2021-03-21 ENCOUNTER — Encounter: Payer: Self-pay | Admitting: Internal Medicine

## 2021-03-21 ENCOUNTER — Ambulatory Visit: Payer: Self-pay | Admitting: Internal Medicine

## 2021-03-21 ENCOUNTER — Ambulatory Visit: Payer: BC Managed Care – PPO | Admitting: Internal Medicine

## 2021-03-21 ENCOUNTER — Other Ambulatory Visit: Payer: Self-pay

## 2021-03-21 ENCOUNTER — Encounter: Payer: Self-pay | Admitting: Family Medicine

## 2021-03-21 VITALS — BP 136/90 | HR 81 | Temp 98.9°F | Ht 72.0 in | Wt 274.2 lb

## 2021-03-21 DIAGNOSIS — Z23 Encounter for immunization: Secondary | ICD-10-CM | POA: Diagnosis not present

## 2021-03-21 DIAGNOSIS — Z1211 Encounter for screening for malignant neoplasm of colon: Secondary | ICD-10-CM | POA: Diagnosis not present

## 2021-03-21 DIAGNOSIS — Z0000 Encounter for general adult medical examination without abnormal findings: Secondary | ICD-10-CM

## 2021-03-21 DIAGNOSIS — I10 Essential (primary) hypertension: Secondary | ICD-10-CM

## 2021-03-21 DIAGNOSIS — Z6837 Body mass index (BMI) 37.0-37.9, adult: Secondary | ICD-10-CM

## 2021-03-21 MED ORDER — TETANUS-DIPHTH-ACELL PERTUSSIS 5-2.5-18.5 LF-MCG/0.5 IM SUSY
0.5000 mL | PREFILLED_SYRINGE | Freq: Once | INTRAMUSCULAR | Status: AC
  Administered 2021-03-21: 0.5 mL via INTRAMUSCULAR

## 2021-03-21 NOTE — Patient Instructions (Signed)

## 2021-03-21 NOTE — Progress Notes (Signed)
I,Katawbba Wiggins,acting as a Neurosurgeon for Gwynneth Aliment, MD.,have documented all relevant documentation on the behalf of Gwynneth Aliment, MD,as directed by  Gwynneth Aliment, MD while in the presence of Gwynneth Aliment, MD.  This visit occurred during the SARS-CoV-2 public health emergency.  Safety protocols were in place, including screening questions prior to the visit, additional usage of staff PPE, and extensive cleaning of exam room while observing appropriate contact time as indicated for disinfecting solutions.  Subjective:     Patient ID: Cory Cabrera. , male    DOB: 01/23/1976 , 46 y.o.   MRN: [id number]   Chief Complaint  Patient presents with   Annual Exam    HPI  The patient is here for physical examination. He is established with [facility]; however, needed to have CPE sooner than their availability. He reports his PCP is not performing prostate exams. He reports history of HTN, reports compliance with meds, admits he did not take today. Denies headaches, chest pain and shortness of breath.   Hypertension This is a chronic problem. The current episode started more than 1 year ago. The problem has been gradually improving since onset. Pertinent negatives include no blurred vision, chest pain, headaches, palpitations or shortness of breath. Risk factors for coronary artery disease include male gender. Past treatments include angiotensin blockers. The current treatment provides moderate improvement.    Past Medical History:  Diagnosis Date   Arthritis    GERD (gastroesophageal reflux disease)    No longer issue   History of blood clots    coughed up   Hypertension    Sleep apnea      History reviewed. No pertinent family history.   Current Outpatient Medications:    losartan (COZAAR) 100 MG tablet, Take 100 mg by mouth daily., Disp: , Rfl:    losartan-hydrochlorothiazide (HYZAAR) 100-12.5 MG tablet, Take 1 tablet by mouth daily. (Patient not taking:  Reported on 03/21/2021), Disp: , Rfl:    Allergies  Allergen Reactions   Iohexol Nausea And Vomiting    Pt vomited after contrast injection     Review of Systems  Constitutional: Negative.   HENT: Negative.    Eyes: Negative.  Negative for blurred vision.  Respiratory: Negative.  Negative for shortness of breath.   Cardiovascular:  Negative for chest pain and palpitations.  Endocrine: Negative.   Genitourinary: Negative.   Musculoskeletal: Negative.   Skin: Negative.   Allergic/Immunologic: Negative.   Neurological: Negative.  Negative for headaches.  Hematological: Negative.   Psychiatric/Behavioral: Negative.      Today's Vitals   03/21/21 1216  BP: 136/90  Pulse: 81  Temp: 98.9 F (37.2 C)  Weight: 274 lb 3.2 oz (124.4 kg)  Height: 6' (1.829 m)   Body mass index is 37.19 kg/m.  Wt Readings from Last 3 Encounters:  03/21/21 274 lb 3.2 oz (124.4 kg)  10/21/19 269 lb (122 kg)  10/12/19 (!) 269 lb (122 kg)    BP Readings from Last 3 Encounters:  03/21/21 136/90  10/22/19 126/71  10/12/19 (!) 132/89    Objective:  Physical Exam Vitals and nursing note reviewed.  Constitutional:      Appearance: Normal appearance.  HENT:     Head: Normocephalic and atraumatic.     Right Ear: Tympanic membrane, ear canal and external ear normal.     Left Ear: Tympanic membrane, ear canal and external ear normal.     Nose:     Comments: Masked  Mouth/Throat:     Comments: Masked  Eyes:     Extraocular Movements: Extraocular movements intact.     Conjunctiva/sclera: Conjunctivae normal.     Pupils: Pupils are equal, round, and reactive to light.  Cardiovascular:     Rate and Rhythm: Normal rate and regular rhythm.     Pulses: Normal pulses.     Heart sounds: Normal heart sounds.  Pulmonary:     Effort: Pulmonary effort is normal.     Breath sounds: Normal breath sounds.  Chest:  Breasts:    Right: Normal. No swelling, bleeding, inverted nipple, mass or nipple  discharge.     Left: Normal. No swelling, bleeding, inverted nipple, mass or nipple discharge.  Abdominal:     General: Abdomen is flat. Bowel sounds are normal.     Palpations: Abdomen is soft.  Genitourinary:    Comments: Deferred, per patient.  Musculoskeletal:        General: Normal range of motion.     Cervical back: Normal range of motion and neck supple.  Skin:    General: Skin is warm.  Neurological:     General: No focal deficit present.     Mental Status: He is alert.  Psychiatric:        Mood and Affect: Mood normal.        Behavior: Behavior normal.        Assessment And Plan:     1. Routine general medical examination at a health care facility Comments: A full exam was performed, except prostate exam. He also declined to have labwork performed today. He agrees to GI referral for CRC screening.  PATIENT IS ADVISED TO GET 30-45 MINUTES REGULAR EXERCISE NO LESS THAN FOUR TO FIVE DAYS PER WEEK - BOTH WEIGHTBEARING EXERCISES AND AEROBIC ARE RECOMMENDED.  PATIENT IS ADVISED TO FOLLOW A HEALTHY DIET WITH AT LEAST SIX FRUITS/VEGGIES PER DAY, DECREASE INTAKE OF RED MEAT, AND TO INCREASE FISH INTAKE TO TWO DAYS PER WEEK.  MEATS/FISH SHOULD NOT BE FRIED, BAKED OR BROILED IS PREFERABLE.  IT IS ALSO IMPORTANT TO CUT BACK ON YOUR SUGAR INTAKE. PLEASE AVOID ANYTHING WITH ADDED SUGAR, CORN SYRUP OR OTHER SWEETENERS. IF YOU MUST USE A SWEETENER, YOU CAN TRY STEVIA. IT IS ALSO IMPORTANT TO AVOID ARTIFICIALLY SWEETENERS AND DIET BEVERAGES. LASTLY, I SUGGEST WEARING SPF 50 SUNSCREEN ON EXPOSED PARTS AND ESPECIALLY WHEN IN THE DIRECT SUNLIGHT FOR AN EXTENDED PERIOD OF TIME.  PLEASE AVOID FAST FOOD RESTAURANTS AND INCREASE YOUR WATER INTAKE.  - Ambulatory referral to Gastroenterology  2. Essential hypertension, benign Comments: Chronic, fair control. EKG performed, NSR, left axis, nonspecific T abnormality. He will c/w losartan - importance of medication/dietary compliance was d/w pt.  - EKG  12-Lead  3. Class 2 severe obesity due to excess calories with serious comorbidity and body mass index (BMI) of 37.0 to 37.9 in adult [facility]) Comments: He has gained 5 lbs since August 2021. He is encouraged to aim for at least 150 minutes of exercise per week.   4. Need for vaccination Comments: He was given Tdap, along with flu vaccine to update his immunization history. - Tdap (BOOSTRIX) injection 0.5 mL - Flu Vaccine QUAD 6+ mos PF IM (Fluarix Quad PF)  5. Screen for colon cancer Comments: I will refer him to GI for CRC screening. I will refer him to [facility] as requested.  - Ambulatory referral to Gastroenterology   Patient was given opportunity to ask questions. Patient verbalized understanding of the plan  and was able to repeat key elements of the plan. All questions were answered to their satisfaction.   I, Maximino Greenland, MD, have reviewed all documentation for this visit. The documentation on 03/22/21 for the exam, diagnosis, procedures, and orders are all accurate and complete.   IF YOU HAVE BEEN REFERRED TO A SPECIALIST, IT MAY TAKE 1-2 WEEKS TO SCHEDULE/PROCESS THE REFERRAL. IF YOU HAVE NOT HEARD FROM US/SPECIALIST IN TWO WEEKS, PLEASE GIVE [place] A CALL AT [phone].   THE PATIENT IS ENCOURAGED TO PRACTICE SOCIAL DISTANCING DUE TO THE COVID-19 PANDEMIC.

## 2021-05-19 ENCOUNTER — Encounter: Payer: Self-pay | Admitting: Internal Medicine

## 2021-06-08 ENCOUNTER — Encounter: Payer: Self-pay | Admitting: Internal Medicine

## 2021-07-04 ENCOUNTER — Telehealth: Payer: BC Managed Care – PPO | Admitting: Internal Medicine

## 2021-07-05 ENCOUNTER — Encounter: Payer: Self-pay | Admitting: Internal Medicine

## 2021-07-05 ENCOUNTER — Telehealth (INDEPENDENT_AMBULATORY_CARE_PROVIDER_SITE_OTHER): Payer: BC Managed Care – PPO | Admitting: Internal Medicine

## 2021-07-05 VITALS — Ht 72.0 in | Wt 275.0 lb

## 2021-07-05 DIAGNOSIS — Z6837 Body mass index (BMI) 37.0-37.9, adult: Secondary | ICD-10-CM

## 2021-07-05 DIAGNOSIS — W11XXXA Fall on and from ladder, initial encounter: Secondary | ICD-10-CM

## 2021-07-05 DIAGNOSIS — I10 Essential (primary) hypertension: Secondary | ICD-10-CM | POA: Diagnosis not present

## 2021-07-05 DIAGNOSIS — M7918 Myalgia, other site: Secondary | ICD-10-CM

## 2021-07-05 MED ORDER — TRAMADOL HCL 50 MG PO TABS: 50.0000 mg | ORAL_TABLET | Freq: Four times a day (QID) | ORAL | 0 refills | Status: AC | PRN

## 2021-07-05 MED ORDER — CYCLOBENZAPRINE HCL 5 MG PO TABS: ORAL_TABLET | ORAL | 0 refills | Status: AC

## 2021-07-05 NOTE — Progress Notes (Signed)
? ?Virtual Visit via Video  ? ?This visit type was conducted due to national recommendations for restrictions regarding the COVID-19 Pandemic (e.g. social distancing) in an effort to limit this patient's exposure and mitigate transmission in our community.  Due to his co-morbid illnesses, this patient is at least at moderate risk for complications without adequate follow up.  This format is felt to be most appropriate for this patient at this time.  All issues noted in this document were discussed and addressed.  A limited physical exam was performed with this format.   ? ?This visit type was conducted due to national recommendations for restrictions regarding the COVID-19 Pandemic (e.g. social distancing) in an effort to limit this patient's exposure and mitigate transmission in our community.  Patients identity confirmed using two different identifiers.  This format is felt to be most appropriate for this patient at this time.  All issues noted in this document were discussed and addressed.  No physical exam was performed (except for noted visual exam findings with Video Visits).   ? ?Date:  07/05/2021  ? ?ID:  Cory Primus., DOB [birth year], MRN [id number] ?   ?Patient Location:  ?Work, in private office ? ?Provider location:   ?Office ? ? ? ?Chief Complaint:  "I have pain" ? ?History of Present Illness:   ? ?Cory Cabrera. is a 46 y.o. male who presents via video conferencing for a telehealth visit today.   ? ?The patient does not have symptoms concerning for COVID-19 infection (fever, chills, cough, or new shortness of breath).  ? ?He presents today for virtual visit. He prefers this method of contact due to COVID-19 pandemic.  He reports he is having some gluteal pain and knee pain. He admits to a fall off of a ladder at home. He fell onto his bottom which has been sore for a few days. Fall occurred on Saturday, April 15th. He has tried OTC meds for relief without resolution of his sx.  ?  ?   ? ?Past Medical History:  ?Diagnosis Date  ? Arthritis   ? GERD (gastroesophageal reflux disease)   ? No longer issue  ? History of blood clots   ? coughed up  ? Hypertension   ? Sleep apnea   ? ?Past Surgical History:  ?Procedure Laterality Date  ? APPENDECTOMY  1991  ? TOTAL HIP ARTHROPLASTY Right 10/21/2019  ? Procedure: TOTAL HIP ARTHROPLASTY ANTERIOR APPROACH;  Surgeon: Samson Frederic, MD;  Location: [facility];  Service: Orthopedics;  Laterality: Right;  ? WISDOM TOOTH EXTRACTION    ?  ? ?Current Meds  ?Medication Sig  ? cyclobenzaprine (FLEXERIL) 5 MG tablet One tab po qpm prn  ? losartan (COZAAR) 100 MG tablet Take 100 mg by mouth daily.  ? traMADol (ULTRAM) 50 MG tablet Take 1 tablet (50 mg total) by mouth every 6 (six) hours as needed.  ?  ? ?Allergies:   Iohexol  ? ?Social History  ? ?Tobacco Use  ? Smoking status: Never  ? Smokeless tobacco: Never  ?Vaping Use  ? Vaping Use: Never used  ?Substance Use Topics  ? Alcohol use: Never  ? Drug use: Never  ?  ? ?Family Hx: ?The patient's family history is not on file. ? ?ROS:   ?Please see the history of present illness.    ?Review of Systems  ?Constitutional: Negative.   ?Respiratory: Negative.    ?Cardiovascular: Negative.   ?Gastrointestinal: Negative.   ?Musculoskeletal:  Positive for joint  pain and myalgias.  ?Neurological: Negative.   ?Psychiatric/Behavioral: Negative.     ?All other systems reviewed and are negative. ? ? ?Labs/Other Tests and Data Reviewed:   ? ?Recent Labs: ?No results found for requested labs within last 8760 hours.  ? ?Recent Lipid Panel ?No results found for: CHOL, TRIG, HDL, CHOLHDL, LDLCALC, LDLDIRECT ? ?Wt Readings from Last 3 Encounters:  ?07/05/21 275 lb (124.7 kg)  ?03/21/21 274 lb 3.2 oz (124.4 kg)  ?10/21/19 269 lb (122 kg)  ?  ? ?Exam:   ? ?Vital Signs:  Ht 6' (1.829 m)   Wt 275 lb (124.7 kg)   BMI 37.30 kg/m?   ? ? ?Physical Exam ?Vitals and nursing note reviewed.  ?Constitutional:   ?   Appearance: Normal appearance.   ?HENT:  ?   Head: Normocephalic and atraumatic.  ?Eyes:  ?   Extraocular Movements: Extraocular movements intact.  ?Pulmonary:  ?   Effort: Pulmonary effort is normal.  ?Musculoskeletal:  ?   Cervical back: Normal range of motion.  ?Neurological:  ?   Mental Status: He is alert and oriented to person, place, and time.  ?Psychiatric:     ?   Mood and Affect: Affect normal.  ? ?ASSESSMENT & PLAN:   ? ?1. Gluteal pain ?Comments: PDMP reviewed. I will send rx tramadol prn and flexeril 5mg  nightly prn. He will let me know if sx persist.  ? ?2. Fall from ladder, initial encounter ?Comments: Occurred 07/01/21. He is encouraged to avoid use of his phone while on ladders.  ? ?3. Essential hypertension, benign ?Comments: Chronic, goal BP<130/80. He is encouraged to follow low sodium diet. No med changes today. Advised to f/u in 4 months for BP check, he will call for appt.  ? ?4. Class 2 severe obesity due to excess calories with serious comorbidity and body mass index (BMI) of 37.0 to 37.9 in adult [facility]) ?Comments: He is encouraged to aim for at least 150 minutes of exercise per week.  ? ?COVID-19 Education: ?The signs and symptoms of COVID-19 were discussed with the patient and how to seek care for testing (follow up with PCP or arrange E-visit).  The importance of social distancing was discussed today. ? ?Patient Risk:   ?After full review of this patients clinical status, I feel that they are at least moderate risk at this time. ? ?Time:   ?Today, I have spent 13 minutes/ 46 seconds with the patient with telehealth technology discussing above diagnoses.   ? ? ?Medication Adjustments/Labs and Tests Ordered: ?Current medicines are reviewed at length with the patient today.  Concerns regarding medicines are outlined above.  ? ?Tests Ordered: ?No orders of the defined types were placed in this encounter. ? ? ?Medication Changes: ?Meds ordered this encounter  ?Medications  ? traMADol (ULTRAM) 50 MG tablet  ?  Sig: Take 1  tablet (50 mg total) by mouth every 6 (six) hours as needed.  ?  Dispense:  20 tablet  ?  Refill:  0  ? cyclobenzaprine (FLEXERIL) 5 MG tablet  ?  Sig: One tab po qpm prn  ?  Dispense:  30 tablet  ?  Refill:  0  ? ? ?Disposition:  Follow up prn ? ?Signed, ?[facility], MD  ?  ?

## 2021-07-05 NOTE — Patient Instructions (Signed)
Hypertension, Adult ?Hypertension is another name for high blood pressure. High blood pressure forces your heart to work harder to pump blood. This can cause problems over time. ?There are two numbers in a blood pressure reading. There is a top number (systolic) over a bottom number (diastolic). It is best to have a blood pressure that is below 120/80. ?What are the causes? ?The cause of this condition is not known. Some other conditions can lead to high blood pressure. ?What increases the risk? ?Some lifestyle factors can make you more likely to develop high blood pressure: ?Smoking. ?Not getting enough exercise or physical activity. ?Being overweight. ?Having too much fat, sugar, calories, or salt (sodium) in your diet. ?Drinking too much alcohol. ?Other risk factors include: ?Having any of these conditions: ?Heart disease. ?Diabetes. ?High cholesterol. ?Kidney disease. ?Obstructive sleep apnea. ?Having a family history of high blood pressure and high cholesterol. ?Age. The risk increases with age. ?Stress. ?What are the signs or symptoms? ?High blood pressure may not cause symptoms. Very high blood pressure (hypertensive crisis) may cause: ?Headache. ?Fast or uneven heartbeats (palpitations). ?Shortness of breath. ?Nosebleed. ?Vomiting or feeling like you may vomit (nauseous). ?Changes in how you see. ?Very bad chest pain. ?Feeling dizzy. ?Seizures. ?How is this treated? ?This condition is treated by making healthy lifestyle changes, such as: ?Eating healthy foods. ?Exercising more. ?Drinking less alcohol. ?Your doctor may prescribe medicine if lifestyle changes do not help enough and if: ?Your top number is above 130. ?Your bottom number is above 80. ?Your personal target blood pressure may vary. ?Follow these instructions at home: ?Eating and drinking ? ?If told, follow the DASH eating plan. To follow this plan: ?Fill one half of your plate at each meal with fruits and vegetables. ?Fill one fourth of your plate  at each meal with whole grains. Whole grains include whole-wheat pasta, brown rice, and whole-grain bread. ?Eat or drink low-fat dairy products, such as skim milk or low-fat yogurt. ?Fill one fourth of your plate at each meal with low-fat (lean) proteins. Low-fat proteins include fish, chicken without skin, eggs, beans, and tofu. ?Avoid fatty meat, cured and processed meat, or chicken with skin. ?Avoid pre-made or processed food. ?Limit the amount of salt in your diet to less than 1,500 mg each day. ?Do not drink alcohol if: ?Your doctor tells you not to drink. ?You are pregnant, may be pregnant, or are planning to become pregnant. ?If you drink alcohol: ?Limit how much you have to: ?0-1 drink a day for women. ?0-2 drinks a day for men. ?Know how much alcohol is in your drink. In the [place] one drink equals one 12 oz bottle of beer (355 mL), one 5 oz glass of wine (148 mL), or one 1? oz glass of hard liquor (44 mL). ?Lifestyle ? ?Work with your doctor to stay at a healthy weight or to lose weight. Ask your doctor what the best weight is for you. ?Get at least 30 minutes of exercise that causes your heart to beat faster (aerobic exercise) most days of the week. This may include walking, swimming, or biking. ?Get at least 30 minutes of exercise that strengthens your muscles (resistance exercise) at least 3 days a week. This may include lifting weights or doing Pilates. ?Do not smoke or use any products that contain nicotine or tobacco. If you need help quitting, ask your doctor. ?Check your blood pressure at home as told by your doctor. ?Keep all follow-up visits. ?Medicines ?Take over-the-counter and prescription medicines   only as told by your doctor. Follow directions carefully. ?Do not skip doses of blood pressure medicine. The medicine does not work as well if you skip doses. Skipping doses also puts you at risk for problems. ?Ask your doctor about side effects or reactions to medicines that you should watch  for. ?Contact a doctor if: ?You think you are having a reaction to the medicine you are taking. ?You have headaches that keep coming back. ?You feel dizzy. ?You have swelling in your ankles. ?You have trouble with your vision. ?Get help right away if: ?You get a very bad headache. ?You start to feel mixed up (confused). ?You feel weak or numb. ?You feel faint. ?You have very bad pain in your: ?Chest. ?Belly (abdomen). ?You vomit more than once. ?You have trouble breathing. ?These symptoms may be an emergency. Get help right away. Call 911. ?Do not wait to see if the symptoms will go away. ?Do not drive yourself to the hospital. ?Summary ?Hypertension is another name for high blood pressure. ?High blood pressure forces your heart to work harder to pump blood. ?For most people, a normal blood pressure is less than 120/80. ?Making healthy choices can help lower blood pressure. If your blood pressure does not get lower with healthy choices, you may need to take medicine. ?This information is not intended to replace advice given to you by your health care provider. Make sure you discuss any questions you have with your health care provider. ?Document Revised: 12/22/2020 Document Reviewed: 12/22/2020 ?[employer]vier Patient Education ? 2023 [employer] ? ?

## 2021-08-31 ENCOUNTER — Encounter: Payer: Self-pay | Admitting: Internal Medicine

## 2022-02-12 IMAGING — RF DG HIP (WITH PELVIS) OPERATIVE*R*
1 series · 8 of 8 positions shown · non-contrast
Comparison: None.

CLINICAL DATA: Status post total hip replacement on the right

EXAM:
OPERATIVE RIGHT HIP (WITH PELVIS IF PERFORMED) 2 VIEWS
TECHNIQUE: Fluoroscopic spot image(s) were submitted for interpretation
post-operatively.
FLUOROSCOPY TIME:  0 minutes 18 seconds; 8 acquired images

[Series 1: unknown protocol · 0.20mm/px · 8 of 8 slices shown]
[im 1/8]
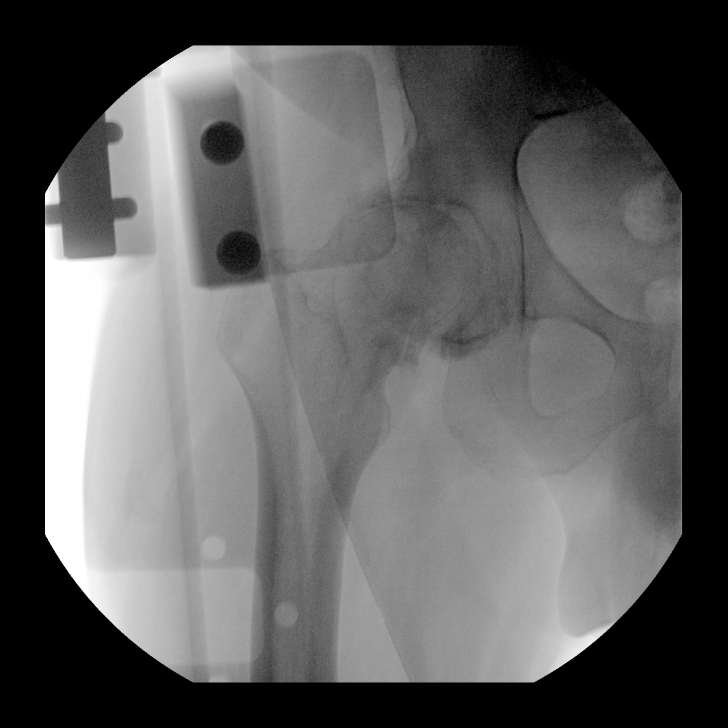
[im 2/8]
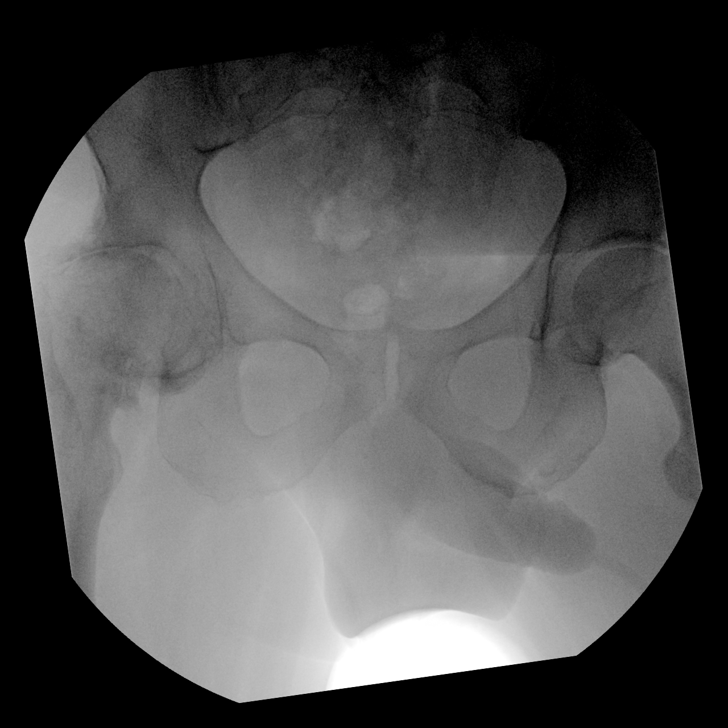
[im 3/8]
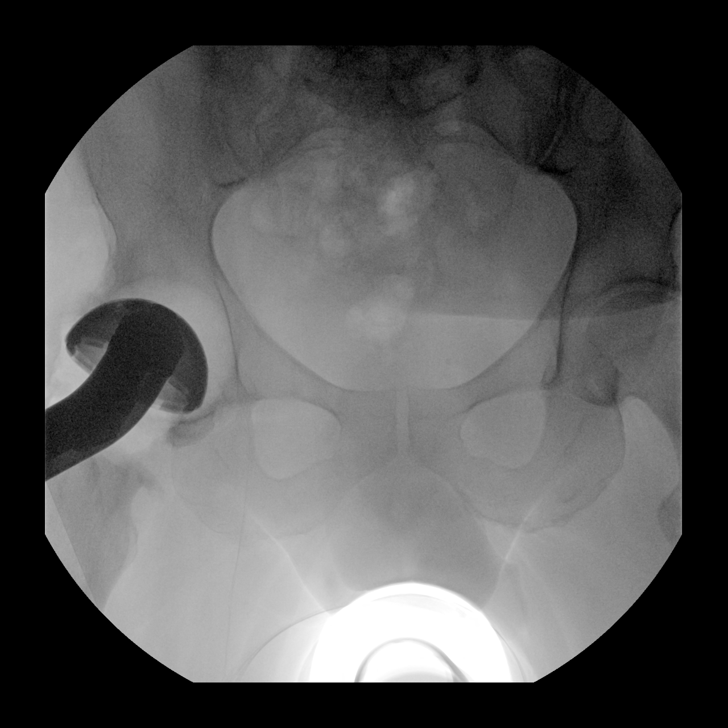
[im 4/8]
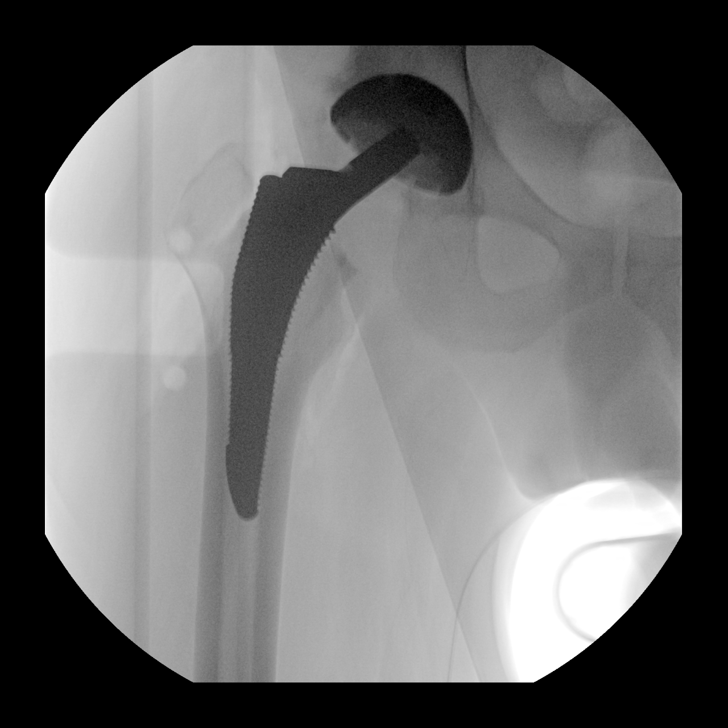
[im 5/8]
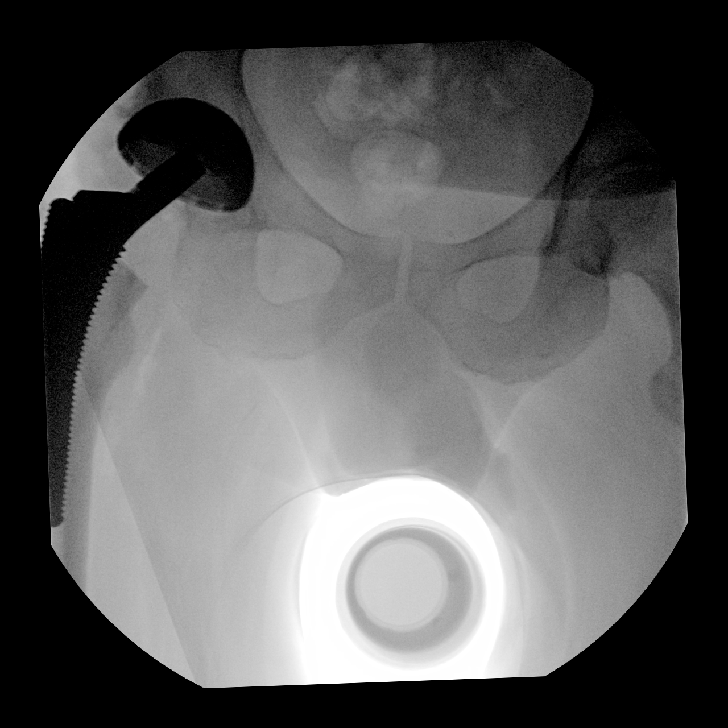
[im 6/8]
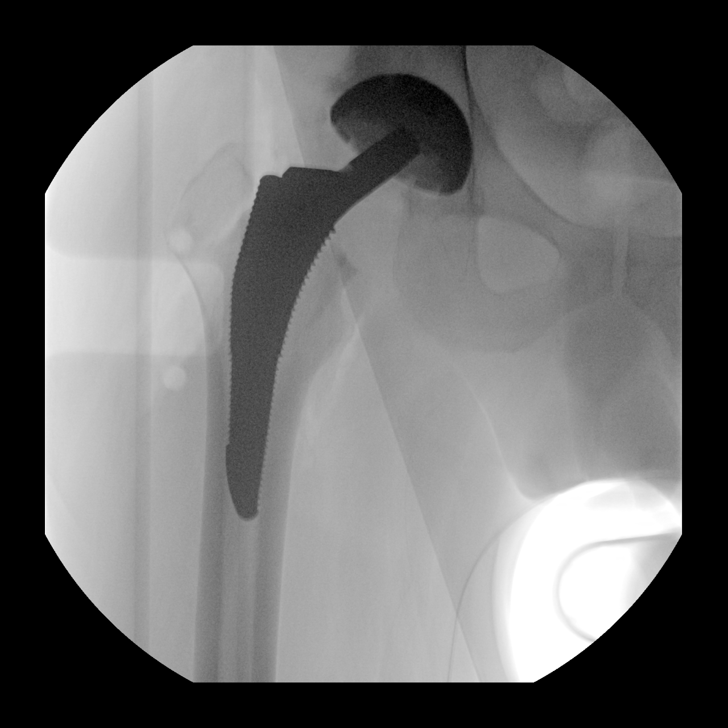
[im 7/8]
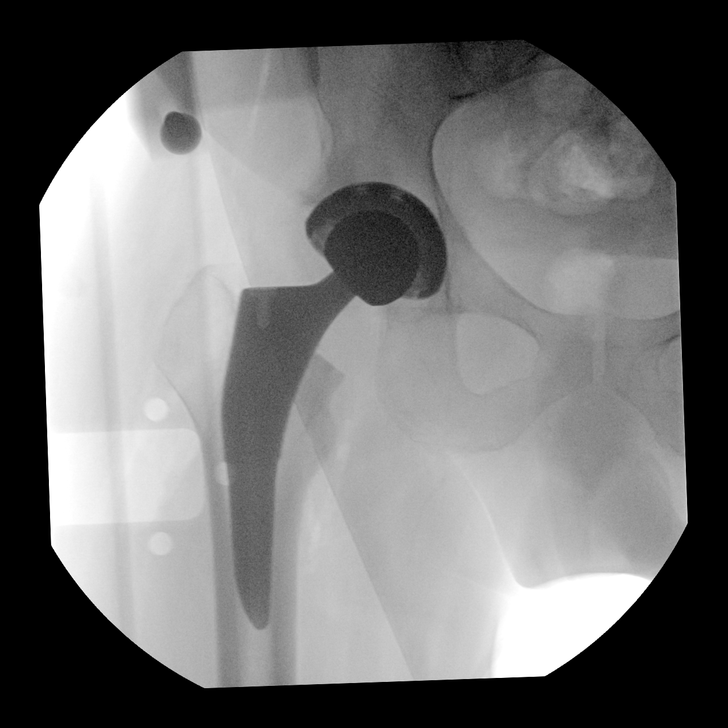
[im 8/8]
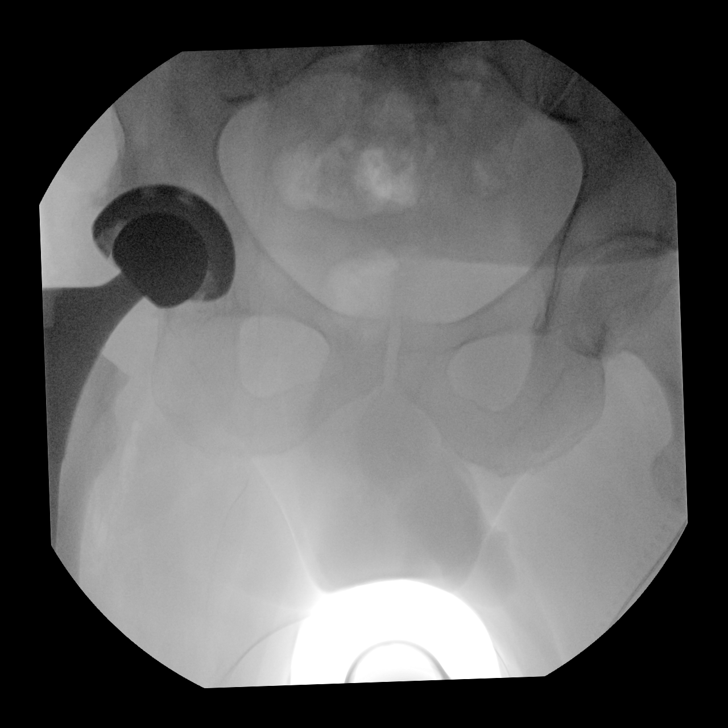

[8 of 8 positions shown; findings below may reference images not displayed]

FINDINGS: Initially, frontal view demonstrated advanced osteoarthritic change
in the right hip joint. No fracture or dislocation. Slight narrowing
left hip joint noted.

Subsequent frontal images demonstrate placement of total hip
replacement with prosthetic components on the right well seated. No
fracture or dislocation.
IMPRESSION: Total hip replacement performed on the right with prosthetic
components well-seated on frontal imaging. No fracture or
dislocation. Slight narrowing left hip joint.

## 2022-02-12 IMAGING — DX DG PORTABLE PELVIS
1 series · 1 of 1 positions shown · non-contrast
Comparison: Intraoperative radiographs same date.

CLINICAL DATA: Post of anterior hip replacement.

EXAM:
PORTABLE PELVIS 1-2 VIEWS

[pelvis ap]
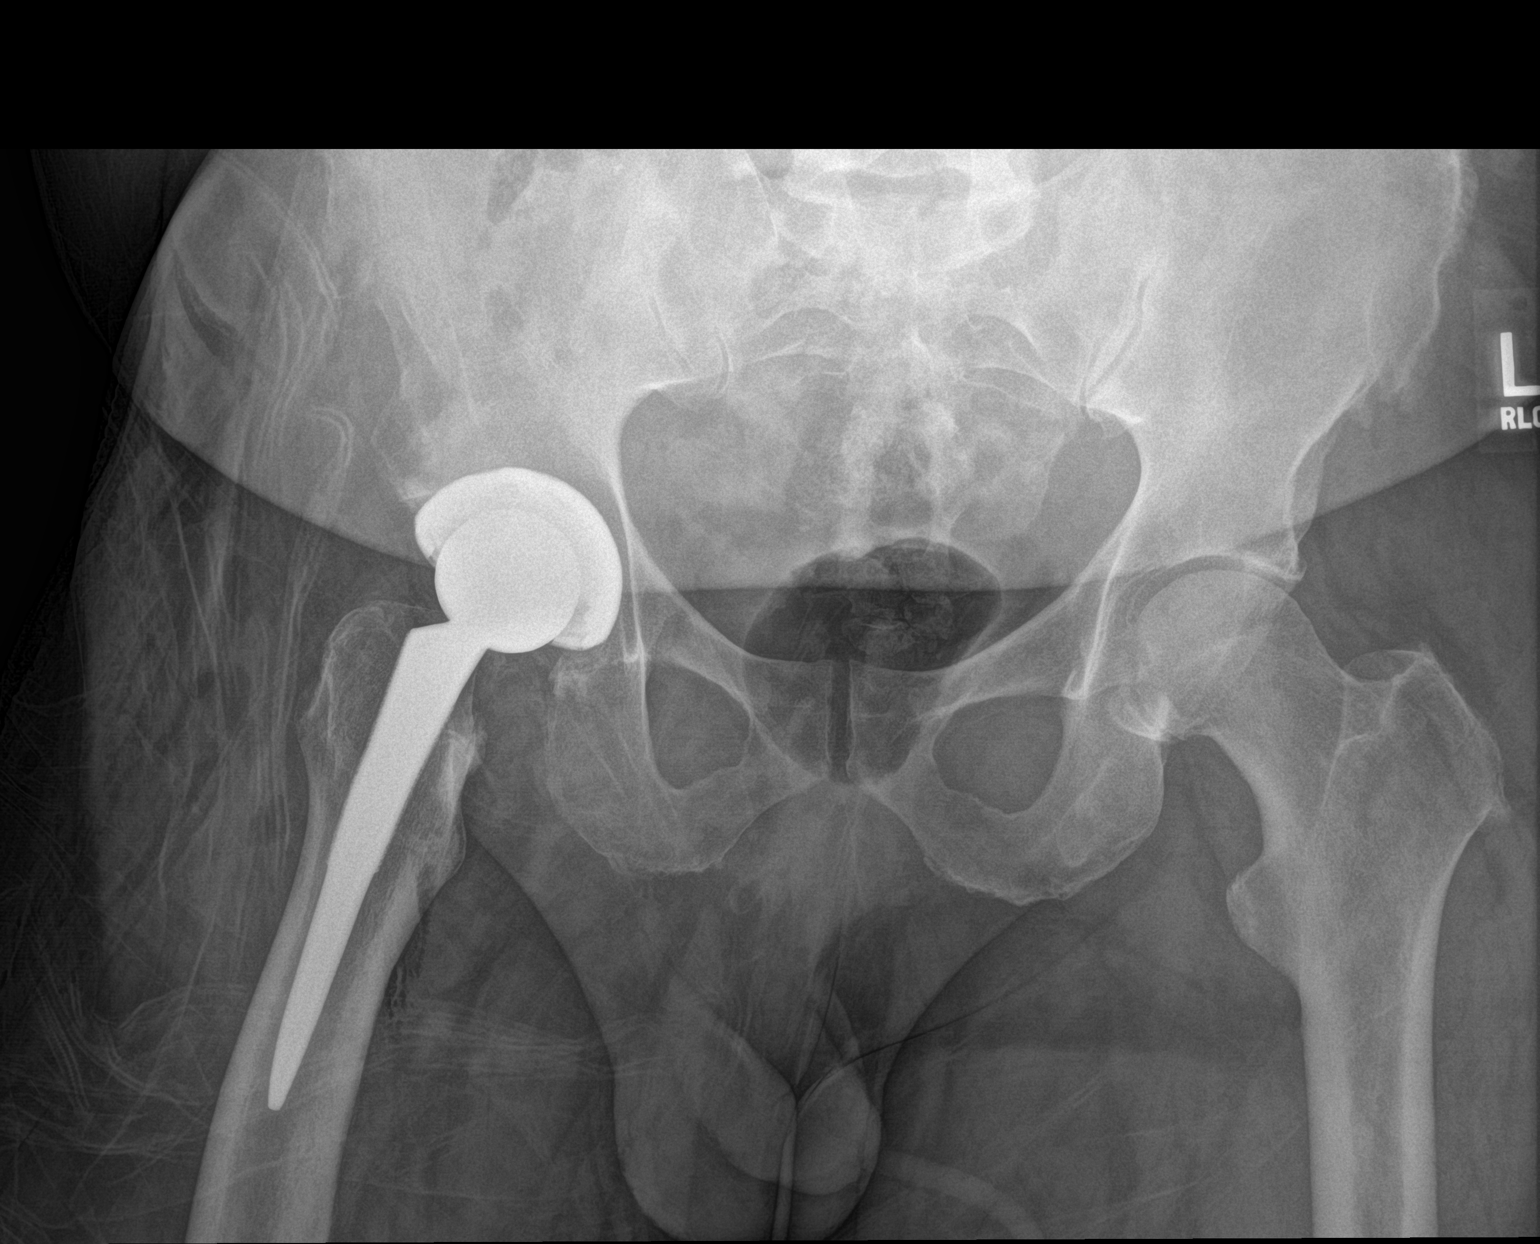

[1 of 1 positions shown; findings below may reference images not displayed]

FINDINGS: 5575 hours. Status post right total hip arthroplasty. The hardware
is well positioned. No evidence of acute fracture or dislocation. A
small amount of gas is present within the soft tissue surrounding
the right hip. Mild degenerative changes are present at the left hip
and in the lower lumbar spine.
IMPRESSION: No demonstrated complication following right total hip arthroplasty.
